# Patient Record
Sex: Male | Born: 1950 | Race: White | Hispanic: No | Marital: Married | Smoking: Never smoker
Health system: Southern US, Community
[De-identification: ages and names within clinical notes are randomized; demographics above are authoritative.]

## PROBLEM LIST (undated history)

## (undated) DIAGNOSIS — E119 Type 2 diabetes mellitus without complications: Secondary | ICD-10-CM

## (undated) DIAGNOSIS — F4310 Post-traumatic stress disorder, unspecified: Secondary | ICD-10-CM

## (undated) DIAGNOSIS — I10 Essential (primary) hypertension: Secondary | ICD-10-CM

## (undated) HISTORY — PX: BACK SURGERY: SHX140

## (undated) HISTORY — PX: HERNIA REPAIR: SHX51

---

## 2016-08-26 ENCOUNTER — Encounter: Payer: Self-pay | Admitting: Emergency Medicine

## 2016-08-26 ENCOUNTER — Emergency Department
Admission: EM | Admit: 2016-08-26 | Discharge: 2016-08-27 | Disposition: A | Discharge status: Other Acute Inpatient Hospital | Payer: Medicare Other | Attending: Emergency Medicine | Admitting: Emergency Medicine

## 2016-08-26 DIAGNOSIS — Y999 Unspecified external cause status: Secondary | ICD-10-CM | POA: Diagnosis not present

## 2016-08-26 DIAGNOSIS — Y929 Unspecified place or not applicable: Secondary | ICD-10-CM | POA: Diagnosis not present

## 2016-08-26 DIAGNOSIS — W2203XA Walked into furniture, initial encounter: Secondary | ICD-10-CM | POA: Diagnosis not present

## 2016-08-26 DIAGNOSIS — E119 Type 2 diabetes mellitus without complications: Secondary | ICD-10-CM | POA: Insufficient documentation

## 2016-08-26 DIAGNOSIS — S81011A Laceration without foreign body, right knee, initial encounter: Secondary | ICD-10-CM | POA: Diagnosis not present

## 2016-08-26 DIAGNOSIS — S8991XA Unspecified injury of right lower leg, initial encounter: Secondary | ICD-10-CM | POA: Diagnosis present

## 2016-08-26 DIAGNOSIS — I10 Essential (primary) hypertension: Secondary | ICD-10-CM | POA: Diagnosis not present

## 2016-08-26 DIAGNOSIS — Y939 Activity, unspecified: Secondary | ICD-10-CM | POA: Diagnosis not present

## 2016-08-26 DIAGNOSIS — S81811A Laceration without foreign body, right lower leg, initial encounter: Secondary | ICD-10-CM

## 2016-08-26 DIAGNOSIS — F4310 Post-traumatic stress disorder, unspecified: Secondary | ICD-10-CM

## 2016-08-26 DIAGNOSIS — R45851 Suicidal ideations: Secondary | ICD-10-CM

## 2016-08-26 HISTORY — DX: Post-traumatic stress disorder, unspecified: F43.10

## 2016-08-26 HISTORY — DX: Type 2 diabetes mellitus without complications: E11.9

## 2016-08-26 HISTORY — DX: Essential (primary) hypertension: I10

## 2016-08-26 LAB — COMPREHENSIVE METABOLIC PANEL
ALBUMIN: 4.1 g/dL (ref 3.5–5.0)
ALT: 107 U/L — ABNORMAL HIGH (ref 17–63)
ANION GAP: 11 (ref 5–15)
AST: 101 U/L — ABNORMAL HIGH (ref 15–41)
Alkaline Phosphatase: 108 U/L (ref 38–126)
BUN: 39 mg/dL — ABNORMAL HIGH (ref 6–20)
CO2: 22 mmol/L (ref 22–32)
Calcium: 9.8 mg/dL (ref 8.9–10.3)
Chloride: 103 mmol/L (ref 101–111)
Creatinine, Ser: 1.24 mg/dL (ref 0.61–1.24)
GFR calc Af Amer: >60 mL/min (ref >60)
GFR calc non Af Amer: 59 mL/min — ABNORMAL LOW (ref >60)
GLUCOSE: 378 mg/dL — AB (ref 65–99)
POTASSIUM: 5 mmol/L (ref 3.5–5.1)
Sodium: 136 mmol/L (ref 135–145)
TOTAL PROTEIN: 8.1 g/dL (ref 6.5–8.1)
Total Bilirubin: 0.6 mg/dL (ref 0.3–1.2)

## 2016-08-26 LAB — CBC
HEMATOCRIT: 44.4 % (ref 40.0–52.0)
Hemoglobin: 15.1 g/dL (ref 13.0–18.0)
MCH: 31.5 pg (ref 26.0–34.0)
MCHC: 34.1 g/dL (ref 32.0–36.0)
MCV: 92.4 fL (ref 80.0–100.0)
PLATELETS: 280 10*3/uL (ref 150–440)
RBC: 4.8 MIL/uL (ref 4.40–5.90)
RDW: 13.6 % (ref 11.5–14.5)
WBC: 7.2 10*3/uL (ref 3.8–10.6)

## 2016-08-26 LAB — SALICYLATE LEVEL: Salicylate Lvl: <7 mg/dL (ref 2.8–30.0)

## 2016-08-26 LAB — ETHANOL

## 2016-08-26 LAB — ACETAMINOPHEN LEVEL

## 2016-08-26 MED ORDER — INSULIN GLARGINE 100 UNIT/ML ~~LOC~~ SOLN
27.0000 [IU] | Freq: Once | SUBCUTANEOUS | Status: AC
  Administered 2016-08-27: 27 [IU] via SUBCUTANEOUS
  Filled 2016-08-26: qty 0.27
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
## 2016-08-26 NOTE — ED Notes (Signed)
EDP okay with no 1:1 if pt stays in hallway with close monitoring.  Pt not to be placed in a room without 1:1 sitter.

## 2016-08-27 LAB — GLUCOSE, CAPILLARY
GLUCOSE-CAPILLARY: 278 mg/dL — AB (ref 65–99)
Glucose-Capillary: 355 mg/dL — ABNORMAL HIGH (ref 65–99)
Glucose-Capillary: 315 mg/dL — ABNORMAL HIGH (ref 65–99)
Glucose-Capillary: 330 mg/dL — ABNORMAL HIGH (ref 65–99)

## 2016-08-27 MED ORDER — LORAZEPAM 2 MG PO TABS
2.0000 mg | ORAL_TABLET | Freq: Four times a day (QID) | ORAL | Status: DC | PRN
  Administered 2016-08-27: 2 mg via ORAL
  Filled 2016-08-27: qty 1

## 2016-08-27 MED ORDER — INSULIN ASPART 100 UNIT/ML ~~LOC~~ SOLN
0.0000 [IU] | Freq: Three times a day (TID) | SUBCUTANEOUS | Status: DC
  Administered 2016-08-27: 7 [IU] via SUBCUTANEOUS
  Filled 2016-08-27: qty 7

## 2016-08-27 NOTE — BH Assessment (Signed)
After Clinical research associatewriter spoke with ER Secretary Carlisle Beers(Luann), he [facility] and spoke with the AOD ([phone] ext. [phone] option 1) and [facility] before [facility] deadline(10pm). If anything was to change, someone would call and update them.

## 2016-08-27 NOTE — ED Notes (Signed)
Spoke with a lady on the phone that reported she is this pts daughter  She reported to me that [facility]  Dr Jeanie SewerMichael Hertzberg  [phone]  [phone]

## 2016-08-27 NOTE — ED Notes (Addendum)
Patient observed lying in bed with eyes closed  Even, unlabored respirations observed   NAD pt appears to be sleeping  I will continue to monitor along with every 15 minute visual observations and ongoing security monitoring     No 1:1 sitter present in his room  q15 minute rounding is occurring

## 2016-08-27 NOTE — ED Notes (Signed)
BEHAVIORAL HEALTH ROUNDING Patient sleeping: No. Patient alert and oriented: yes Behavior appropriate: Yes.  ; If no, describe:  Nutrition and fluids offered: yes Toileting and hygiene offered: Yes  Sitter present: q15 minute observations and security  monitoring Law enforcement present: Yes  ODS  

## 2016-08-27 NOTE — ED Notes (Signed)
[facility]   Pt to transport at this time

## 2016-08-27 NOTE — BH Assessment (Signed)
Patient has been accepted to [facility].  Accepting physician is Dr. Darrick Grinder.  Call report to [phone] ext. [phone].  Representative was [employer].  ER Staff is aware of it (Luann,ER Sect.& Amy T. Patient's Nurse)   Patient will need to arrived to the [facility] before 10:00pm

## 2016-08-27 NOTE — ED Notes (Signed)
Called SOC to initiate consult [phone]

## 2016-08-27 NOTE — ED Notes (Signed)
Pt ate breakfast.

## 2016-08-27 NOTE — ED Notes (Signed)
ED BHU PLACEMENT JUSTIFICATION Is the patient under IVC or is there intent for IVC: Yes.   Is the patient medically cleared: Yes.   Ac/hs CBG Is there vacancy in the ED BHU: Yes.   Is the population mix appropriate for patient: Yes.   Is the patient awaiting placement in inpatient or outpatient setting:    Has the patient had a psychiatric consult: Yes.  SOC  Survey of unit performed for contraband, proper placement and condition of furniture, tampering with fixtures in bathroom, shower, and each patient room: Yes.  ; Findings:  APPEARANCE/BEHAVIOR Calm and cooperative NEURO ASSESSMENT Orientation: oriented to self, place, situation   Denies pain Hallucinations: No.None noted (Hallucinations) Speech: Normal Gait: normal RESPIRATORY ASSESSMENT Even  Unlabored respirations  CARDIOVASCULAR ASSESSMENT Pulses equal   regular rate  Skin warm and dry   GASTROINTESTINAL ASSESSMENT no GI complaint EXTREMITIES Full ROM  PLAN OF CARE Provide calm/safe environment. Vital signs assessed twice daily. ED BHU Assessment once each 12-hour shift. Collaborate with TTS daily or as condition indicates. Assure the ED provider has rounded once each shift. Provide and encourage hygiene. Provide redirection as needed. Assess for escalating behavior; address immediately and inform ED provider.  Assess family dynamic and appropriateness for visitation as needed: Yes.  ; If necessary, describe findings:  Educate the patient/family about BHU procedures/visitation: Yes.  ; If necessary, describe findings:

## 2016-08-27 NOTE — BH Assessment (Signed)
Information faxed to [facility] and confirmed it was received ([phone] ext. [phone]). Pending review at this time. 

## 2016-08-27 NOTE — ED Notes (Signed)
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
## 2016-08-27 NOTE — ED Notes (Signed)
Pt with a phone call to [phone] - pt talking - loud at times  NAD observed

## 2016-08-27 NOTE — ED Notes (Signed)
Lantus verified by Spero GeraldsButch Woods, RN

## 2016-08-27 NOTE — ED Notes (Signed)
BEHAVIORAL HEALTH ROUNDING Patient sleeping: Yes.   Patient alert and oriented: eyes closed  Appears to be asleep Behavior appropriate: Yes.  ; If no, describe:  Nutrition and fluids offered: Yes  Toileting and hygiene offered: sleeping Sitter present: q 15 minute observations and security monitoring Law enforcement present: yes  ODS 

## 2016-08-27 NOTE — Progress Notes (Signed)
Inpatient Diabetes Program Recommendations  AACE/ADA: New Consensus Statement on Inpatient Glycemic Control (2015)  Target Ranges:  Prepandial:   less than 140 mg/dL      Peak postprandial:   less than 180 mg/dL (1-2 hours)      Critically ill patients:  140 - 180 mg/dL   Review of Glycemic Control  Diabetes history: DM 2 Outpatient Diabetes medications: Not listed Current orders for Inpatient glycemic control: None  Inpatient Diabetes Program Recommendations:   Glucose trend 355, 278 this am. [facility]. Consider Carb Modified diet while here. May also want to place patient on Novolog Moderate (0-15 units) correction TID + Novolog HS scale (0-5 units).  Thanks,  Christena DeemShannon Raveen Wieseler RN, MSN, [facility] Inpatient Diabetes Coordinator Team Pager [phone] (8a-5p)

## 2016-08-27 NOTE — ED Notes (Signed)
Called for transport [phone] [facility]

## 2016-10-25 ENCOUNTER — Encounter: Payer: Self-pay | Admitting: Emergency Medicine

## 2016-10-25 ENCOUNTER — Emergency Department: Payer: Medicare Other

## 2016-10-25 ENCOUNTER — Inpatient Hospital Stay
Admission: EM | Admit: 2016-10-25 | Discharge: 2016-10-26 | DRG: 918 | Disposition: A | Discharge status: Other Acute Inpatient Hospital | Payer: Medicare Other | Attending: Internal Medicine | Admitting: Internal Medicine

## 2016-10-25 DIAGNOSIS — Z7984 Long term (current) use of oral hypoglycemic drugs: Secondary | ICD-10-CM

## 2016-10-25 DIAGNOSIS — E162 Hypoglycemia, unspecified: Secondary | ICD-10-CM | POA: Diagnosis present

## 2016-10-25 DIAGNOSIS — F4310 Post-traumatic stress disorder, unspecified: Secondary | ICD-10-CM | POA: Diagnosis present

## 2016-10-25 DIAGNOSIS — K219 Gastro-esophageal reflux disease without esophagitis: Secondary | ICD-10-CM | POA: Diagnosis present

## 2016-10-25 DIAGNOSIS — I2510 Atherosclerotic heart disease of native coronary artery without angina pectoris: Secondary | ICD-10-CM | POA: Diagnosis present

## 2016-10-25 DIAGNOSIS — T1491XA Suicide attempt, initial encounter: Secondary | ICD-10-CM | POA: Diagnosis not present

## 2016-10-25 DIAGNOSIS — R778 Other specified abnormalities of plasma proteins: Secondary | ICD-10-CM | POA: Diagnosis present

## 2016-10-25 DIAGNOSIS — Z046 Encounter for general psychiatric examination, requested by authority: Secondary | ICD-10-CM

## 2016-10-25 DIAGNOSIS — R7989 Other specified abnormal findings of blood chemistry: Secondary | ICD-10-CM

## 2016-10-25 DIAGNOSIS — R748 Abnormal levels of other serum enzymes: Secondary | ICD-10-CM | POA: Diagnosis present

## 2016-10-25 DIAGNOSIS — Z955 Presence of coronary angioplasty implant and graft: Secondary | ICD-10-CM

## 2016-10-25 DIAGNOSIS — Z79899 Other long term (current) drug therapy: Secondary | ICD-10-CM

## 2016-10-25 DIAGNOSIS — I10 Essential (primary) hypertension: Secondary | ICD-10-CM | POA: Diagnosis present

## 2016-10-25 DIAGNOSIS — Z888 Allergy status to other drugs, medicaments and biological substances status: Secondary | ICD-10-CM | POA: Diagnosis not present

## 2016-10-25 DIAGNOSIS — T383X2A Poisoning by insulin and oral hypoglycemic [antidiabetic] drugs, intentional self-harm, initial encounter: Secondary | ICD-10-CM

## 2016-10-25 DIAGNOSIS — F39 Unspecified mood [affective] disorder: Secondary | ICD-10-CM | POA: Diagnosis present

## 2016-10-25 DIAGNOSIS — E11649 Type 2 diabetes mellitus with hypoglycemia without coma: Secondary | ICD-10-CM | POA: Diagnosis present

## 2016-10-25 LAB — CBC WITH DIFFERENTIAL/PLATELET
BASOS PCT: 1 %
Basophils Absolute: 0 10*3/uL (ref 0–0.1)
EOS ABS: 0.3 10*3/uL (ref 0–0.7)
Eosinophils Relative: 5 %
HCT: 36.1 % — ABNORMAL LOW (ref 40.0–52.0)
HEMOGLOBIN: 13 g/dL (ref 13.0–18.0)
Lymphocytes Relative: 32 %
Lymphs Abs: 1.9 10*3/uL (ref 1.0–3.6)
MCH: 33.1 pg (ref 26.0–34.0)
MCHC: 36.1 g/dL — ABNORMAL HIGH (ref 32.0–36.0)
MCV: 91.6 fL (ref 80.0–100.0)
Monocytes Absolute: 0.5 10*3/uL (ref 0.2–1.0)
Monocytes Relative: 9 %
NEUTROS PCT: 53 %
Neutro Abs: 3.2 10*3/uL (ref 1.4–6.5)
Platelets: 221 10*3/uL (ref 150–440)
RBC: 3.94 MIL/uL — AB (ref 4.40–5.90)
RDW: 13.5 % (ref 11.5–14.5)
WBC: 5.9 10*3/uL (ref 3.8–10.6)

## 2016-10-25 LAB — TROPONIN I
Troponin I: 0.25 ng/mL (ref <0.03)
Troponin I: 0.22 ng/mL (ref <0.03)
Troponin I: 0.24 ng/mL

## 2016-10-25 LAB — URINALYSIS, COMPLETE (UACMP) WITH MICROSCOPIC
BACTERIA UA: NONE SEEN
BILIRUBIN URINE: NEGATIVE
Glucose, UA: >=500 mg/dL — AB
Hgb urine dipstick: NEGATIVE
Ketones, ur: NEGATIVE mg/dL
Leukocytes, UA: NEGATIVE
NITRITE: NEGATIVE
PROTEIN: 30 mg/dL — AB
SPECIFIC GRAVITY, URINE: 1.005 (ref 1.005–1.030)
pH: 6 (ref 5.0–8.0)

## 2016-10-25 LAB — COMPREHENSIVE METABOLIC PANEL
ALBUMIN: 3.3 g/dL — AB (ref 3.5–5.0)
ALK PHOS: 88 U/L (ref 38–126)
ALT: 59 U/L (ref 17–63)
ANION GAP: 8 (ref 5–15)
AST: 59 U/L — ABNORMAL HIGH (ref 15–41)
BUN: 20 mg/dL (ref 6–20)
CALCIUM: 8.5 mg/dL — AB (ref 8.9–10.3)
CO2: 25 mmol/L (ref 22–32)
CREATININE: 0.75 mg/dL (ref 0.61–1.24)
Chloride: 108 mmol/L (ref 101–111)
GFR calc Af Amer: >60 mL/min (ref >60)
GFR calc non Af Amer: >60 mL/min (ref >60)
GLUCOSE: 135 mg/dL — AB (ref 65–99)
Potassium: 3.5 mmol/L (ref 3.5–5.1)
SODIUM: 141 mmol/L (ref 135–145)
Total Bilirubin: 0.6 mg/dL (ref 0.3–1.2)
Total Protein: 6.9 g/dL (ref 6.5–8.1)

## 2016-10-25 LAB — ACETAMINOPHEN LEVEL

## 2016-10-25 LAB — GLUCOSE, CAPILLARY
GLUCOSE-CAPILLARY: 40 mg/dL — AB (ref 65–99)
GLUCOSE-CAPILLARY: 69 mg/dL (ref 65–99)
GLUCOSE-CAPILLARY: 82 mg/dL (ref 65–99)
Glucose-Capillary: 111 mg/dL — ABNORMAL HIGH (ref 65–99)
Glucose-Capillary: 134 mg/dL — ABNORMAL HIGH (ref 65–99)
Glucose-Capillary: 222 mg/dL — ABNORMAL HIGH (ref 65–99)
Glucose-Capillary: 39 mg/dL — CL (ref 65–99)
Glucose-Capillary: 51 mg/dL — ABNORMAL LOW (ref 65–99)
Glucose-Capillary: 66 mg/dL (ref 65–99)
Glucose-Capillary: 68 mg/dL (ref 65–99)

## 2016-10-25 LAB — URINE DRUG SCREEN, QUALITATIVE (ARMC ONLY)
Amphetamines, Ur Screen: NOT DETECTED
BARBITURATES, UR SCREEN: NOT DETECTED
BENZODIAZEPINE, UR SCRN: NOT DETECTED
CANNABINOID 50 NG, UR ~~LOC~~: NOT DETECTED
Cocaine Metabolite,Ur ~~LOC~~: NOT DETECTED
MDMA (Ecstasy)Ur Screen: NOT DETECTED
METHADONE SCREEN, URINE: NOT DETECTED
OPIATE, UR SCREEN: NOT DETECTED
PHENCYCLIDINE (PCP) UR S: NOT DETECTED
Tricyclic, Ur Screen: NOT DETECTED

## 2016-10-25 LAB — GLUCOSE, RANDOM: Glucose, Bld: 54 mg/dL — ABNORMAL LOW (ref 65–99)

## 2016-10-25 LAB — SALICYLATE LEVEL: Salicylate Lvl: <7 mg/dL (ref 2.8–30.0)

## 2016-10-25 LAB — ETHANOL

## 2016-10-25 MED ORDER — DOCUSATE SODIUM 100 MG PO CAPS
100.0000 mg | ORAL_CAPSULE | Freq: Two times a day (BID) | ORAL | Status: DC
Start: 2016-10-25 — End: 2016-10-26
  Administered 2016-10-26: 10:00:00 100 mg via ORAL
  Filled 2016-10-25: qty 1

## 2016-10-25 MED ORDER — DEXTROSE 50 % IV SOLN
INTRAVENOUS | Status: AC
  Administered 2016-10-25: 25 mL via INTRAVENOUS
  Filled 2016-10-25: qty 50

## 2016-10-25 MED ORDER — ACETAMINOPHEN 325 MG PO TABS: 650.0000 mg | ORAL_TABLET | Freq: Four times a day (QID) | ORAL | Status: DC | PRN

## 2016-10-25 MED ORDER — DEXTROSE 50 % IV SOLN
25.0000 g | Freq: Once | INTRAVENOUS | Status: AC
  Administered 2016-10-25: 50 mL via INTRAVENOUS

## 2016-10-25 MED ORDER — ASPIRIN EC 81 MG PO TBEC
81.0000 mg | DELAYED_RELEASE_TABLET | Freq: Every day | ORAL | Status: DC
  Administered 2016-10-26: 81 mg via ORAL
  Filled 2016-10-25: qty 1

## 2016-10-25 MED ORDER — DEXTROSE 10 % IV SOLN
INTRAVENOUS | Status: DC
  Administered 2016-10-25 – 2016-10-26 (×2): via INTRAVENOUS

## 2016-10-25 MED ORDER — DEXTROSE 50 % IV SOLN
INTRAVENOUS | Status: AC
  Administered 2016-10-25: 50 mL via INTRAVENOUS
  Filled 2016-10-25: qty 50

## 2016-10-25 MED ORDER — ONDANSETRON HCL 4 MG PO TABS: 4.0000 mg | ORAL_TABLET | Freq: Four times a day (QID) | ORAL | Status: DC | PRN

## 2016-10-25 MED ORDER — ONDANSETRON HCL 4 MG/2ML IJ SOLN: 4.0000 mg | Freq: Four times a day (QID) | INTRAMUSCULAR | Status: DC | PRN

## 2016-10-25 MED ORDER — ASPIRIN 81 MG PO CHEW
324.0000 mg | CHEWABLE_TABLET | Freq: Once | ORAL | Status: AC
  Administered 2016-10-25: 324 mg via ORAL
  Filled 2016-10-25: qty 4

## 2016-10-25 MED ORDER — ACETAMINOPHEN 650 MG RE SUPP: 650.0000 mg | Freq: Four times a day (QID) | RECTAL | Status: DC | PRN

## 2016-10-25 MED ORDER — ENOXAPARIN SODIUM 40 MG/0.4ML ~~LOC~~ SOLN
40.0000 mg | SUBCUTANEOUS | Status: DC
Start: 2016-10-25 — End: 2016-10-26

## 2016-10-25 MED ORDER — DEXTROSE 50 % IV SOLN
25.0000 mL | Freq: Once | INTRAVENOUS | Status: AC
  Administered 2016-10-25: 25 mL via INTRAVENOUS

## 2016-10-25 MED ORDER — SENNOSIDES-DOCUSATE SODIUM 8.6-50 MG PO TABS: 1.0000 | ORAL_TABLET | Freq: Every evening | ORAL | Status: DC | PRN

## 2016-10-25 NOTE — Care Management Note (Signed)
Case Management Note  Patient Details  Name: Gerald Ponce MRN: [id number] Date of Birth: 23-Apr-1951  Subjective/Objective:         Patient is a [facility] patient per MD, but is too unstable for transport  . Paperwork completed for same by Dr Wilfred LacyJ. Williams, and unit clerk Tomi BambergerLisa Wheeler will contact VA to provide notification. Please note-Pt. Is also IVC'd at this time.         Action/Plan:   Expected Discharge Date:                  Expected Discharge Plan:     In-House Referral:     Discharge planning Services     Post Acute Care Choice:    Choice offered to:     DME Arranged:    DME Agency:     HH Arranged:    HH Agency:     Status of Service:     If discussed at [employer] Length of Stay Meetings, dates discussed:    Additional Comments:  Berna BueCheryl Advaith Lamarque, RN 10/25/2016, 1:54 PM

## 2016-10-25 NOTE — ED Notes (Signed)
Pt alert and oriented. Given orange juice to drink.

## 2016-10-25 NOTE — Progress Notes (Addendum)
Blood sugar 69, patient given orange juice and is eating meal

## 2016-10-25 NOTE — H&P (Signed)
[facility] - [place] at [facility]   PATIENT NAME: Gerald Ponce    MR#:  [id number]  DATE OF BIRTH:  [birth year]  DATE OF ADMISSION:  10/25/2016  PRIMARY CARE PHYSICIAN[facility]  REQUESTING/REFERRING PHYSICIAN: Dr. Mayford KnifeWilliams  CHIEF COMPLAINT:   I took multiple shots of insulin to harm myself. Patient presents with suicidal intention. HISTORY OF PRESENT ILLNESS:  Gerald Ponce  is a 66 y.o. male with a known history of PTSD, diabetes, hypertension, CAD status post stent in the remote past comes to the emergency room after he was found unresponsive by patient's family member. Patient reportedly took several doses off unknown amount of insulin NovoLog in attempt for suicidal ideation and killing himself. He was found unresponsive. EMS brought him to the emergency room sugar was 111. And Route he did receive D50. I could not find documentation of how low patient's sugar was spent in EMS found patient. Patient currently is alert oriented 3 current sugar is 66 he has had several cups of orange juice and food to eat. His stepdaughter is in the room Patient reports it was suicidal ideation/intention since he has a lot going on. He apparently had a court date today. Patient reports to much is going around and I cannot take it anymore IVC was initiated by ER physician  Patient is being admitted with hypoglycemia secondary to overdose on insulin, intentional Past Medical History:  Diagnosis Date  . Diabetes mellitus without complication (HCC)   . Hypertension   . PTSD (post-traumatic stress disorder)     PAST SURGICAL HISTOIRY:   Past Surgical History:  Procedure Laterality Date  . BACK SURGERY    . HERNIA REPAIR      SOCIAL HISTORY:   Social History  Substance Use Topics  . Smoking status: Never Smoker  . Smokeless tobacco: Never Used  . Alcohol use No    FAMILY HISTORY:  No family history on file.  DRUG ALLERGIES:   Allergies  Allergen Reactions   . Fenofibrate Other (See Comments)    "kidney shut down"    REVIEW OF SYSTEMS:  Review of Systems  Constitutional: Negative for chills, fever and weight loss.  HENT: Negative for ear discharge, ear pain and nosebleeds.   Eyes: Negative for blurred vision, pain and discharge.  Respiratory: Negative for sputum production, shortness of breath, wheezing and stridor.   Cardiovascular: Negative for chest pain, palpitations, orthopnea and PND.  Gastrointestinal: Negative for abdominal pain, diarrhea, nausea and vomiting.  Genitourinary: Negative for frequency and urgency.  Musculoskeletal: Negative for back pain and joint pain.  Neurological: Positive for weakness. Negative for sensory change, speech change and focal weakness.  Psychiatric/Behavioral: Negative for depression and hallucinations. The patient is not nervous/anxious.      MEDICATIONS AT HOME:   Prior to Admission medications   Medication Sig Start Date End Date Taking? Authorizing Provider  metFORMIN (GLUCOPHAGE) 1000 MG tablet Take 500 mg by mouth 2 (two) times daily with a meal.   Yes Historical Provider, MD  omeprazole (PRILOSEC) 20 MG capsule Take 20 mg by mouth daily.   Yes Historical Provider, MD      VITAL SIGNS:  Blood pressure (!) 190/75, pulse 68, temperature 97.6 F (36.4 C), temperature source Oral, resp. rate 12, height 5\' 10"  (1.778 m), weight 81.6 kg (180 lb), SpO2 99 %.  PHYSICAL EXAMINATION:  GENERAL:  66 y.o.-year-old patient lying in the bed with no acute distress.  EYES: Pupils equal, round, reactive to light  and accommodation. No scleral icterus. Extraocular muscles intact.  HEENT: Head atraumatic, normocephalic. Oropharynx and nasopharynx clear.  NECK:  Supple, no jugular venous distention. No thyroid enlargement, no tenderness.  LUNGS: Normal breath sounds bilaterally, no wheezing, rales,rhonchi or crepitation. No use of accessory muscles of respiration.  CARDIOVASCULAR: S1, S2 normal. No murmurs,  rubs, or gallops.  ABDOMEN: Soft, nontender, nondistended. Bowel sounds present. No organomegaly or mass.  EXTREMITIES: No pedal edema, cyanosis, or clubbing.  NEUROLOGIC: Cranial nerves II through XII are intact. Muscle strength 5/5 in all extremities. Sensation intact. Gait not checked.  PSYCHIATRIC: The patient is alert and oriented x 3.  SKIN: No obvious rash, lesion, or ulcer.   LABORATORY PANEL:   CBC  Recent Labs Lab 10/25/16 1243  WBC 5.9  HGB 13.0  HCT 36.1*  PLT 221   ------------------------------------------------------------------------------------------------------------------  Chemistries   Recent Labs Lab 10/25/16 1243  NA 141  K 3.5  CL 108  CO2 25  GLUCOSE 135*  BUN 20  CREATININE 0.75  CALCIUM 8.5*  AST 59*  ALT 59  ALKPHOS 88  BILITOT 0.6   ------------------------------------------------------------------------------------------------------------------  Cardiac Enzymes  Recent Labs Lab 10/25/16 1243  TROPONINI 0.25*   ------------------------------------------------------------------------------------------------------------------  RADIOLOGY:  Dg Chest 1 View  Result Date: 10/25/2016 CLINICAL DATA:  Possible overdose this morning. EXAM: CHEST 1 VIEW COMPARISON:  None. FINDINGS: Low volume chest with interstitial crowding. There is no edema, consolidation, effusion, or pneumothorax. Normal heart size and mediastinal contours. Coronary stent noted. IMPRESSION: Low volume portable chest.  No acute finding. Electronically Signed   By: Marnee Spring M.D.   On: 10/25/2016 13:20    EKG:    IMPRESSION AND PLAN:   Gerald Ponce  is a 66 y.o. male with a known history of PTSD, diabetes, hypertension, CAD status post stent in the remote past comes to the emergency room after he was found unresponsive by patient's family member. Patient reportedly took several doses off unknown amount of insulin NovoLog in attempt for suicidal ideation and  killing himself. He was found unresponsive. EMS brought him to the emergency room sugar was 111. And Route he did receive D50. I could not find documentation of how low patient's sugar was spent in EMS found patient.   1. Hypoglycemia secondary to intentional overdose with NovoLog unknown amount  -Admitted to medical floor  -Patient has IVC. Will have sitter in the room  -Continue IV D10 and check sugars every 3 hourly  -Psych consultation secondary to intentional overdose and suicidal ideation  -Hold off all oral diabetes meds   2. Elevated troponin with history of CAD -Patient denies any chest pain, EKG is just not sure any acute ST elevation or depression -We'll cycle cardiac enzymes and continue aspirin -Patient's daughter to bring his medication list  3. PTSD -We'll resume psych meds after psych consultation is done  4. GERD continue omeprazole  5. DVT prophylaxis subcutaneous Lovenox   All the records are reviewed and case discussed with ED provider. Management plans discussed with the patient, family and they are in agreement.  CODE STATUS :Full  TOTAL TIME TAKING CARE OF THIS PATIENT : .    Kristjan Derner M.D on 10/25/2016 at 2:32 PM  Between 7am to 6pm - Pager - [phone]  After 6pm go to [email or web address] - password [email or web address] [facility]  [facility]  Office  [phone]  CC: Primary care physician; Pcp Not In System

## 2016-10-25 NOTE — ED Notes (Signed)
SPOKE  WITH  LARRY AT  [facility]  [facility]  TOLD HIM  PT  WAS  TO  UNSTABLE  FOR  TRANSFER PER  DR  Mayford KnifeWILLIAMS MD ALL  [facility]  PAPERWORK  ON  CHART  INFORMED  CHERYL  WILDER

## 2016-10-25 NOTE — Progress Notes (Addendum)
CBG 39 , 54 per lab.  Gave D50 25 gram IV.  Repeat CBG is 134.  Spoke with Dr Anne HahnWillis and increased D10 IV to 100 cc/hr.  Pt drank a carton of milk.  Will repeat at 0000.  Pt reports blurred vision but no other symptoms. Henriette CombsSarah Kaan Tosh RN

## 2016-10-25 NOTE — ED Notes (Signed)
CBG 40 mg/dl. Dr. Mayford KnifeWilliams notified. Orders received.

## 2016-10-25 NOTE — Care Management Note (Signed)
Case Management Note  Patient Details  Name: Ronnette HilaJames Omar MRN: [id number] Date of Birth: 08-17-1951  Subjective/Objective:       [facility] the patient and unstable status at present. Pt. Is going to be admitted here and will be under IVC.    They will follow for changes in stability and possible transfer later.         Action/Plan:   Expected Discharge Date:                  Expected Discharge Plan:     In-House Referral:     Discharge planning Services     Post Acute Care Choice:    Choice offered to:     DME Arranged:    DME Agency:     HH Arranged:    HH Agency:     Status of Service:     If discussed at [employer] Length of Stay Meetings, dates discussed:    Additional Comments:  Berna BueCheryl Kaitland Lewellyn, RN 10/25/2016, 2:09 PM
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
## 2016-10-25 NOTE — ED Notes (Addendum)
Dr. Allena KatzPatel at bedside. Pt alert and oriented. Pt eating graham crackers and peanut butter.
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
## 2016-10-25 NOTE — ED Triage Notes (Addendum)
Pt arrived via EMS from home. Pt overdosed on unknown amount of  Insulin. EMS reports pt was unresponsive on arrival. EMS administered narcan and dextrose. VSS.

## 2016-10-25 NOTE — ED Provider Notes (Signed)
[facility] Emergency Department Provider Note        Time seen: ----------------------------------------- 12:45 PM on 10/25/2016 -----------------------------------------  L5 caveat: Review of systems and history is limited by altered mental status  I have reviewed the triage vital signs and the nursing notes.   HISTORY  Chief Complaint Drug Overdose    HPI Gerald Ponce is a 66 y.o. male who presents to ER being brought by EMS from home after an overdose. Patient reportedly had taken an unknown amount of his insulin in order to kill himself. Patient was due in court today. He is brought in unresponsive.   Past Medical History:  Diagnosis Date  . Diabetes mellitus without complication (HCC)   . Hypertension   . PTSD (post-traumatic stress disorder)     There are no active problems to display for this patient.   Past Surgical History:  Procedure Laterality Date  . BACK SURGERY    . HERNIA REPAIR      Allergies Fenofibrate  Social History Social History  Substance Use Topics  . Smoking status: Never Smoker  . Smokeless tobacco: Never Used  . Alcohol use No    Review of Systems Unknown ____________________________________________   PHYSICAL EXAM:  VITAL SIGNS: ED Triage Vitals  Enc Vitals Group     BP 10/25/16 1242 (!) 190/75     Pulse Rate 10/25/16 1242 68     Resp 10/25/16 1242 12     Temp 10/25/16 1242 97.6 F (36.4 C)     Temp Source 10/25/16 1242 Oral     SpO2 10/25/16 1242 99 %     Weight 10/25/16 1240 180 lb (81.6 kg)     Height 10/25/16 1240 5\' 10"  (1.778 m)     Head Circumference --      Peak Flow --      Pain Score --      Pain Loc --      Pain Edu? --      Excl. in GC? --     Constitutional: Patient is not alert and poorly responsive to pain Eyes: Conjunctivae are normal. PERRL. Normal extraocular movements. ENT   Head: Normocephalic and atraumatic.   Nose: No congestion/rhinnorhea.    Mouth/Throat: Mucous membranes are moist.   Neck: No stridor. Cardiovascular: Normal rate, regular rhythm. No murmurs, rubs, or gallops. Respiratory: Normal respiratory effort without tachypnea nor retractions. Breath sounds are clear and equal bilaterally. No wheezes/rales/rhonchi. Gastrointestinal: Soft and nontender. Normal bowel sounds Musculoskeletal: Nontender with normal range of motion in all extremities.  Neurologic:  Normal speech and language. No gross focal neurologic deficits are appreciated.  Skin:  Skin is warm, dry and intact. No rash noted. ____________________________________________  EKG: Interpreted by me. Sinus rhythm with rate of 65 bpm, normal PR interval, normal QRS, normal QT.  ____________________________________________  ED COURSE:  Pertinent labs & imaging results that were available during my care of the patient were reviewed by me and considered in my medical decision making (see chart for details). Patient presents to ER in no distress with altered mental status after an overdose. Left EJ peripheral 20-gauge IV placed by me  Patient subsequently stimulated with painful stimuli such as a tongue depressor into the posterior pharynx. Patient quickly regained consciousness and removed the tongue depressor. He is now answering questions appropriately. He will be placed under involuntary commitment.   Procedures ____________________________________________   LABS (pertinent positives/negatives)  Labs Reviewed  CBC WITH DIFFERENTIAL/PLATELET - Abnormal; Notable for the following:  Result Value   RBC 3.94 (*)    HCT 36.1 (*)    MCHC 36.1 (*)    All other components within normal limits  COMPREHENSIVE METABOLIC PANEL - Abnormal; Notable for the following:    Glucose, Bld 135 (*)    Calcium 8.5 (*)    Albumin 3.3 (*)    AST 59 (*)    All other components within normal limits  TROPONIN I - Abnormal; Notable for the following:    Troponin I 0.25 (*)     All other components within normal limits  URINALYSIS, COMPLETE (UACMP) WITH MICROSCOPIC - Abnormal; Notable for the following:    Color, Urine STRAW (*)    APPearance CLEAR (*)    Glucose, UA >=500 (*)    Protein, ur 30 (*)    Squamous Epithelial / LPF 0-5 (*)    All other components within normal limits  ACETAMINOPHEN LEVEL - Abnormal; Notable for the following:    Acetaminophen (Tylenol), Serum <10 (*)    All other components within normal limits  GLUCOSE, CAPILLARY - Abnormal; Notable for the following:    Glucose-Capillary 111 (*)    All other components within normal limits  ETHANOL  SALICYLATE LEVEL  URINE DRUG SCREEN, QUALITATIVE ([facility] ONLY)  CBG MONITORING, ED   CRITICAL CARE Performed by: Emily FilbertWilliams, Jonathan E   Total critical care time: 30 minutes  Critical care time was exclusive of separately billable procedures and treating other patients.  Critical care was necessary to treat or prevent imminent or life-threatening deterioration.  Critical care was time spent personally by me on the following activities: development of treatment plan with patient and/or surrogate as well as nursing, discussions with consultants, evaluation of patient's response to treatment, examination of patient, obtaining history from patient or surrogate, ordering and performing treatments and interventions, ordering and review of laboratory studies, ordering and review of radiographic studies, pulse oximetry and re-evaluation of patient's condition.  RADIOLOGY Images were viewed by me  Chest x-ray IMPRESSION: Low volume portable chest. No acute finding. ____________________________________________  FINAL ASSESSMENT AND PLAN  Insulin Overdose, elevated troponin, involuntary commitment  Plan: Patient with labs and imaging as dictated above. Patient had presented to the ER unresponsive which was largely behavioral in etiology. He is now responsive and following commands. Patient  describes that he is going through a difficult time and he is due to be seen in the court system. Patient is currently on a D10 drip, he has received additional D50 here. He also has elevated troponin, he is being given oral aspirin. He will likely need step down admission for close monitoring. He is also under involuntary commitment.   Emily FilbertWilliams, Jonathan E, MD   Note: This note was generated in part or whole with voice recognition software. Voice recognition is usually quite accurate but there are transcription errors that can and very often do occur. I apologize for any typographical errors that were not detected and corrected.     Emily FilbertJonathan E Williams, MD 10/25/16 [id number]

## 2016-10-26 ENCOUNTER — Encounter: Payer: Self-pay | Admitting: Psychiatry

## 2016-10-26 ENCOUNTER — Inpatient Hospital Stay
Admission: RE | Admit: 2016-10-26 | Discharge: 2016-10-29 | DRG: 885 | Disposition: A | Discharge status: Home / Self Care | Payer: Medicare Other | Source: Intra-hospital | Attending: Psychiatry | Admitting: Psychiatry

## 2016-10-26 DIAGNOSIS — K219 Gastro-esophageal reflux disease without esophagitis: Secondary | ICD-10-CM | POA: Diagnosis present

## 2016-10-26 DIAGNOSIS — E1140 Type 2 diabetes mellitus with diabetic neuropathy, unspecified: Secondary | ICD-10-CM | POA: Diagnosis present

## 2016-10-26 DIAGNOSIS — F329 Major depressive disorder, single episode, unspecified: Secondary | ICD-10-CM | POA: Diagnosis present

## 2016-10-26 DIAGNOSIS — T1491XA Suicide attempt, initial encounter: Secondary | ICD-10-CM | POA: Diagnosis present

## 2016-10-26 DIAGNOSIS — Z79899 Other long term (current) drug therapy: Secondary | ICD-10-CM

## 2016-10-26 DIAGNOSIS — I10 Essential (primary) hypertension: Secondary | ICD-10-CM | POA: Diagnosis present

## 2016-10-26 DIAGNOSIS — F419 Anxiety disorder, unspecified: Secondary | ICD-10-CM | POA: Diagnosis present

## 2016-10-26 DIAGNOSIS — F4310 Post-traumatic stress disorder, unspecified: Secondary | ICD-10-CM | POA: Diagnosis present

## 2016-10-26 DIAGNOSIS — Z888 Allergy status to other drugs, medicaments and biological substances status: Secondary | ICD-10-CM

## 2016-10-26 DIAGNOSIS — E559 Vitamin D deficiency, unspecified: Secondary | ICD-10-CM | POA: Diagnosis present

## 2016-10-26 DIAGNOSIS — F39 Unspecified mood [affective] disorder: Secondary | ICD-10-CM | POA: Diagnosis present

## 2016-10-26 DIAGNOSIS — Z7984 Long term (current) use of oral hypoglycemic drugs: Secondary | ICD-10-CM

## 2016-10-26 DIAGNOSIS — E785 Hyperlipidemia, unspecified: Secondary | ICD-10-CM | POA: Diagnosis present

## 2016-10-26 DIAGNOSIS — E8881 Metabolic syndrome: Secondary | ICD-10-CM | POA: Diagnosis present

## 2016-10-26 LAB — TROPONIN I: Troponin I: 0.2 ng/mL (ref <0.03)

## 2016-10-26 LAB — GLUCOSE, CAPILLARY
GLUCOSE-CAPILLARY: 218 mg/dL — AB (ref 65–99)
GLUCOSE-CAPILLARY: 156 mg/dL — AB (ref 65–99)
GLUCOSE-CAPILLARY: 292 mg/dL — AB (ref 65–99)
GLUCOSE-CAPILLARY: 292 mg/dL — AB (ref 65–99)
GLUCOSE-CAPILLARY: 247 mg/dL — AB (ref 65–99)
GLUCOSE-CAPILLARY: 141 mg/dL — AB (ref 65–99)

## 2016-10-26 MED ORDER — ASPIRIN 81 MG PO TBEC: 81.0000 mg | DELAYED_RELEASE_TABLET | Freq: Every day | ORAL | 0 refills | Status: DC

## 2016-10-26 MED ORDER — LISINOPRIL 10 MG PO TABS
40.0000 mg | ORAL_TABLET | Freq: Every day | ORAL | Status: DC
  Administered 2016-10-26: 40 mg via ORAL
  Filled 2016-10-26: qty 4

## 2016-10-26 MED ORDER — PRAZOSIN HCL 2 MG PO CAPS
2.0000 mg | ORAL_CAPSULE | Freq: Every day | ORAL | Status: DC
  Administered 2016-10-26: 2 mg via ORAL
  Filled 2016-10-26: qty 1

## 2016-10-26 MED ORDER — ATORVASTATIN CALCIUM 20 MG PO TABS
80.0000 mg | ORAL_TABLET | Freq: Every day | ORAL | Status: DC
  Administered 2016-10-26 – 2016-10-28 (×3): 80 mg via ORAL
  Filled 2016-10-26 (×3): qty 4

## 2016-10-26 MED ORDER — DILTIAZEM HCL ER COATED BEADS 120 MG PO CP24
120.0000 mg | ORAL_CAPSULE | Freq: Every day | ORAL | Status: DC
  Administered 2016-10-26: 120 mg via ORAL
  Filled 2016-10-26: qty 1

## 2016-10-26 MED ORDER — ALUM & MAG HYDROXIDE-SIMETH 200-200-20 MG/5ML PO SUSP: 30.0000 mL | ORAL | Status: DC | PRN

## 2016-10-26 MED ORDER — ASPIRIN EC 81 MG PO TBEC
81.0000 mg | DELAYED_RELEASE_TABLET | Freq: Every day | ORAL | Status: DC
  Administered 2016-10-26 – 2016-10-29 (×4): 81 mg via ORAL
  Filled 2016-10-26 (×4): qty 1

## 2016-10-26 MED ORDER — HYDROXYZINE HCL 50 MG PO TABS
50.0000 mg | ORAL_TABLET | Freq: Three times a day (TID) | ORAL | Status: DC | PRN
Start: 2016-10-26 — End: 2016-10-29
  Administered 2016-10-27 – 2016-10-28 (×2): 50 mg via ORAL
  Filled 2016-10-26 (×2): qty 1

## 2016-10-26 MED ORDER — INSULIN ASPART 100 UNIT/ML ~~LOC~~ SOLN
0.0000 [IU] | Freq: Three times a day (TID) | SUBCUTANEOUS | Status: DC
  Administered 2016-10-26: 12:00:00 5 [IU] via SUBCUTANEOUS
  Filled 2016-10-26: qty 5

## 2016-10-26 MED ORDER — ACETAMINOPHEN 325 MG PO TABS: 650.0000 mg | ORAL_TABLET | Freq: Four times a day (QID) | ORAL | Status: DC | PRN

## 2016-10-26 MED ORDER — GABAPENTIN 300 MG PO CAPS
300.0000 mg | ORAL_CAPSULE | Freq: Two times a day (BID) | ORAL | Status: DC
  Administered 2016-10-26 – 2016-10-29 (×6): 300 mg via ORAL
  Filled 2016-10-26 (×6): qty 1

## 2016-10-26 MED ORDER — MAGNESIUM HYDROXIDE 400 MG/5ML PO SUSP: 30.0000 mL | Freq: Every day | ORAL | Status: DC | PRN

## 2016-10-26 MED ORDER — VITAMIN D 1000 UNITS PO TABS
1000.0000 [IU] | ORAL_TABLET | Freq: Every day | ORAL | Status: DC
  Administered 2016-10-26 – 2016-10-29 (×4): 1000 [IU] via ORAL
  Filled 2016-10-26 (×5): qty 1

## 2016-10-26 MED ORDER — PANTOPRAZOLE SODIUM 40 MG PO TBEC
40.0000 mg | DELAYED_RELEASE_TABLET | Freq: Every day | ORAL | Status: DC
  Administered 2016-10-26 – 2016-10-29 (×4): 40 mg via ORAL
  Filled 2016-10-26 (×4): qty 1

## 2016-10-26 MED ORDER — METFORMIN HCL 500 MG PO TABS
500.0000 mg | ORAL_TABLET | Freq: Two times a day (BID) | ORAL | Status: DC
  Administered 2016-10-26 – 2016-10-29 (×6): 500 mg via ORAL
  Filled 2016-10-26 (×6): qty 1

## 2016-10-26 MED ORDER — INSULIN ASPART 100 UNIT/ML ~~LOC~~ SOLN
0.0000 [IU] | Freq: Every day | SUBCUTANEOUS | Status: DC
  Filled 2016-10-26: qty 3

## 2016-10-26 MED ORDER — INSULIN ASPART 100 UNIT/ML ~~LOC~~ SOLN
0.0000 [IU] | Freq: Three times a day (TID) | SUBCUTANEOUS | Status: DC
  Administered 2016-10-26: 5 [IU] via SUBCUTANEOUS
  Administered 2016-10-27: 2 [IU] via SUBCUTANEOUS
  Administered 2016-10-27: 8 [IU] via SUBCUTANEOUS
  Administered 2016-10-27: 11 [IU] via SUBCUTANEOUS
  Administered 2016-10-28: 2 [IU] via SUBCUTANEOUS
  Administered 2016-10-28: 5 [IU] via SUBCUTANEOUS
  Administered 2016-10-29 (×2): 3 [IU] via SUBCUTANEOUS
  Filled 2016-10-26: qty 3
  Filled 2016-10-26: qty 15
  Filled 2016-10-26: qty 8
  Filled 2016-10-26: qty 5
  Filled 2016-10-26: qty 3
  Filled 2016-10-26: qty 2
  Filled 2016-10-26: qty 13
  Filled 2016-10-26: qty 7

## 2016-10-26 MED ORDER — INSULIN ASPART 100 UNIT/ML ~~LOC~~ SOLN
4.0000 [IU] | Freq: Three times a day (TID) | SUBCUTANEOUS | Status: DC
  Administered 2016-10-26 – 2016-10-27 (×4): 4 [IU] via SUBCUTANEOUS
  Filled 2016-10-26 (×2): qty 4

## 2016-10-26 MED ORDER — VENLAFAXINE HCL ER 75 MG PO CP24
150.0000 mg | ORAL_CAPSULE | Freq: Every day | ORAL | Status: DC
  Administered 2016-10-27 – 2016-10-29 (×3): 150 mg via ORAL
  Filled 2016-10-26 (×3): qty 2

## 2016-10-26 NOTE — Progress Notes (Signed)
Patient pleasant and cooperative during admission assessment. Patient denies SI/HI at this time. Patient denies AVH. Patient informed of fall risk status, fall risk assessed "moderate" at this time. Patient oriented to unit/staff/room. Patient denies any questions/concerns at this time. Patient safe on unit with Q15 minute checks for safety. Skin assessment & body search done,no contraband found.

## 2016-10-26 NOTE — Care Management (Signed)
Spoke with Tania at [facility]. Discussed that [facility]. Gerald [facility]once [facility].  Tania indicated that after [facility], Mr Gerald Ponce would need to given the option to [facility]. Gwenette GreetBrenda S Romeka Scifres RN MSN CCM Care Management

## 2016-10-26 NOTE — Progress Notes (Signed)
Inpatient Diabetes Program Recommendations  AACE/ADA: New Consensus Statement on Inpatient Glycemic Control (2015)  Target Ranges:  Prepandial:   less than 140 mg/dL      Peak postprandial:   less than 180 mg/dL (1-2 hours)      Critically ill patients:  140 - 180 mg/dL   Lab Results  Component Value Date   GLUCAP 156 (H) 10/26/2016    Review of Glycemic Control  Results for Gerald Ponce, Gerald Ponce (MRN [id number]) as of 10/26/2016 07:45  Ref. Range 10/25/2016 21:45 10/25/2016 22:08 10/26/2016 00:03 10/26/2016 02:47 10/26/2016 05:58  Glucose-Capillary Latest Ref Range: 65 - 99 mg/dL 39 (LL) 914134 (H) 782222 (H) 218 (H) 156 (H)    Diabetes history: Type 2 Outpatient Diabetes medications: Metformin 500mg  bid Current orders for Inpatient glycemic control: none  Inpatient Diabetes Program Recommendations:   Consider d/c CBG checks q3h and conder starting Novolog 0-9 units tid with meals   Per ADA recommendations "consider performing an A1C on all patients with diabetes or hyperglycemia admitted to the hospital if not performed in the prior 3 months".  Susette RacerJulie Gizelle Whetsel, RN, BA, MHA, CDE Diabetes Coordinator Inpatient Diabetes Program  [phone] (Team Pager) [phone] [facility] Office) 10/26/2016 7:52 AM

## 2016-10-26 NOTE — Discharge Instructions (Signed)
[facility] [place] at [facility]  DIET:  Diabetic diet  DISCHARGE CONDITION:  Good  ACTIVITY:  Activity as tolerated  OXYGEN:  Home Oxygen: No.   Oxygen Delivery: room air  DISCHARGE LOCATION:  home    ADDITIONAL DISCHARGE INSTRUCTION:   If you experience worsening of your admission symptoms, develop shortness of breath, life threatening emergency, suicidal or homicidal thoughts you must seek medical attention immediately by calling 911 or calling your MD immediately  if symptoms less severe.  You Must read complete instructions/literature along with all the possible adverse reactions/side effects for all the Medicines you take and that have been prescribed to you. Take any new Medicines after you have completely understood and accpet all the possible adverse reactions/side effects.   Please note  You were cared for by a hospitalist during your hospital stay. If you have any questions about your discharge medications or the care you received while you were in the hospital after you are discharged, you can call the unit and asked to speak with the hospitalist on call if the hospitalist that took care of you is not available. Once you are discharged, your primary care physician will handle any further medical issues. Please note that NO REFILLS for any discharge medications will be authorized once you are discharged, as it is imperative that you return to your primary care physician (or establish a relationship with a primary care physician if you do not have one) for your aftercare needs so that they can reassess your need for medications and monitor your lab values.

## 2016-10-26 NOTE — [facility] Suicide Risk Assessment (Signed)
[facility]   Nursing information obtained from:    Demographic factors:    Current Mental Status:    Loss Factors:    Historical Factors:    Risk Reduction Factors:     Total Time spent with patient: 1 hour Principal Problem: Episodic mood disorder (HCC) Diagnosis:   Patient Active Problem List   Diagnosis Date Noted  . HTN (hypertension) [I10] 10/26/2016  . Dyslipidemia [E78.5] 10/26/2016  . GERD (gastroesophageal reflux disease) [K21.9] 10/26/2016  . Hypoglycemia [E16.2] 10/25/2016  . PTSD (post-traumatic stress disorder) [F43.10] 10/25/2016  . Suicide attempt [T14.91XA] 10/25/2016  . Episodic mood disorder (HCC) [F39] 10/25/2016   Subjective Data: suicide attempt.  Continued Clinical Symptoms:  Alcohol Use Disorder Identification Test Final Score (AUDIT): 0 The "Alcohol Use Disorders Identification Test", Guidelines for Use in Primary Care, Second Edition.  World Science writerHealth Organization [facility](WHO). Score between 0-7:  no or low risk or alcohol related problems. Score between 8-15:  moderate risk of alcohol related problems. Score between 16-19:  high risk of alcohol related problems. Score 20 or above:  warrants further diagnostic evaluation for alcohol dependence and treatment.   CLINICAL FACTORS:   Severe Anxiety and/or Agitation Depression:   Impulsivity Medical Diagnoses and Treatments/Surgeries   Musculoskeletal: Strength & Muscle Tone: within normal limits Gait & Station: normal Patient leans: N/A  Psychiatric Specialty Exam: Physical Exam  Nursing note and vitals reviewed. Psychiatric: Gerald Ponce speech is normal and behavior is normal. Gerald Ponce mood appears anxious. Gerald Ponce affect is labile. Cognition and memory are normal. He expresses impulsivity. He expresses suicidal ideation. He expresses suicidal plans.    Review of Systems  Psychiatric/Behavioral: Positive for depression and suicidal ideas. The patient is nervous/anxious.   All other systems  reviewed and are negative.   Blood pressure (!) 158/89, pulse 86, temperature 98.3 F (36.8 C), temperature source Oral, resp. rate 18, height 5\' 10"  (1.778 m), weight 88.9 kg (196 lb), SpO2 99 %.Body mass index is 28.12 kg/m.  General Appearance: Casual  Eye Contact:  Good  Speech:  Clear and Coherent  Volume:  Normal  Mood:  Euphoric  Affect:  Congruent and Tearful  Thought Process:  Goal Directed and Descriptions of Associations: Intact  Orientation:  Full (Time, Place, and Person)  Thought Content:  WDL  Suicidal Thoughts:  No  Homicidal Thoughts:  No  Memory:  Immediate;   Fair Recent;   Fair Remote;   Fair  Judgement:  Poor  Insight:  Lacking  Psychomotor Activity:  Normal  Concentration:  Concentration: Fair and Attention Span: Fair  Recall:  FiservFair  Fund of Knowledge:  Fair  Language:  Fair  Akathisia:  No  Handed:  Right  AIMS (if indicated):     Assets:  Communication Skills Desire for Improvement Financial Resources/Insurance Housing Physical Health Resilience Social Support  ADL's:  Intact  Cognition:  WNL  Sleep:         COGNITIVE FEATURES THAT CONTRIBUTE TO RISK:  None    SUICIDE RISK:   Moderate:  Frequent suicidal ideation with limited intensity, and duration, some specificity in terms of plans, no associated intent, good self-control, limited dysphoria/symptomatology, some risk factors present, and identifiable protective factors, including available and accessible social support.  PLAN OF CARE: Hospital admission, medication management, discharge planning.  Gerald Ponce is a 66 year old male with a history of depression and PTSD transfer from medical floor when he was briefly hospitalized after insulin overdose in the context of severe social  stressors.  1. Suicidal ideation. The patient is able to contract for safety in the hospital.  2. Mood. We will increase Effexor 250 mg for depression.  3. PTSD. We will continue Minipress at bedtime for  nightmares.  4. Diabetes. He is on metformin, sliding scale insulin, ADA diet, and blood glucose monitoring. Per diabetes nurse coordinator we will start 4 units of Novolog with meals. The patient claims to take insulin glargine and aspart 35/25 unit in am, 20/25 units at bedtime.  5. Dyslipidemia. He is on atorvastatin.  6.  Hypertension. He is on diltiazem and lisinopril.   7. GERD. He is on Protonix.   8. Diabetic neuropathy. He is on Neurontin.  9. Vitamin D deficiency. He is on V3 supplementation.  10. Disposition. He will be discharged to home. He will follow up with the [place].  I certify that inpatient services furnished can reasonably be expected to improve the patient's condition.   Kristine Linea, MD 10/26/2016, 3:43 PM

## 2016-10-26 NOTE — Progress Notes (Signed)
[facility]. No discharge paperwork given to patient because of  Epic functionality. Report given to Gigi in unit. Patient will escort with nurse and security guard to unit. Patient and family member has talked with nursing supervisor Annice PihJackie and unit director Vida RollerShannon Wineman about [facility]. All instructions given and questions answered.

## 2016-10-26 NOTE — Progress Notes (Signed)
Pt. Shared with Chaplain how he has been overwhelmed by taking care of his wife who has dementia. Pt. has two cases in court which are other stress factors. Pt. Is an active member of a [employer] [place]. Pt. Attempted to end his life when he felt overwhelmed. Chaplain encouraged Pt. To reach out for a help when life gets tough and he can't cope. Furthermore, Chaplain helped patient to understand that life may bring some challenges, but is worth living. Chaplain prayed with patient.

## 2016-10-26 NOTE — Discharge Summary (Signed)
[facility] [place] at [facility], 65 y.o., DOB 1951/08/25, MRN [id number]. Admission date: 10/25/2016 Discharge Date 10/26/2016 Primary MD Pcp Not In System Admitting Physician Enedina Finner, MD  Admission Diagnosis  Elevated troponin I level [R74.8] Involuntary commitment [Z04.6] Insulin overdose, intentional self-harm, initial encounter [facility]) [T38.3X2A]  Discharge Diagnosis   Principal Problem:   Suicide attempt   Hypoglycemia   PTSD (post-traumatic stress disorder)   Episodic mood disorder [facility])          Hospital Course  Gerald Ponce  is a 66 y.o. male with a known history of PTSD, diabetes, hypertension, CAD status post stent in the remote past comes to the emergency room after he was found unresponsive by patient's family member. Patient reportedly took several doses off unknown amount of insulin NovoLog in attempt for suicidal ideation and killing himself. Pt admitted and started on d10 bg now stable.  Pt seen by psychiatry recommends inpatient psychiatry therapy.            Consults  psychiatry  Significant Tests:  See full reports for all details    Dg Chest 1 View  Result Date: 10/25/2016 CLINICAL DATA:  Possible overdose this morning. EXAM: CHEST 1 VIEW COMPARISON:  None. FINDINGS: Low volume chest with interstitial crowding. There is no edema, consolidation, effusion, or pneumothorax. Normal heart size and mediastinal contours. Coronary stent noted. IMPRESSION: Low volume portable chest.  No acute finding. Electronically Signed   By: Marnee Spring M.D.   On: 10/25/2016 13:20       Today   Subjective:   Gerald Ponce  Feels better no complatins  Objective:   Blood pressure (!) 188/67, pulse 66, temperature 98.4 F (36.9 C), temperature source Oral, resp. rate 20, height 5\' 10"  (1.778 m), weight 180 lb (81.6 kg), SpO2 100 %.  .  Intake/Output Summary (Last 24 hours) at 10/26/16 1257 Last data filed at 10/26/16  1110  Gross per 24 hour  Intake          2591.67 ml  Output                0 ml  Net          2591.67 ml    Exam VITAL SIGNS: Blood pressure (!) 188/67, pulse 66, temperature 98.4 F (36.9 C), temperature source Oral, resp. rate 20, height 5\' 10"  (1.778 m), weight 180 lb (81.6 kg), SpO2 100 %.  GENERAL:  66 y.o.-year-old patient lying in the bed with no acute distress.  EYES: Pupils equal, round, reactive to light and accommodation. No scleral icterus. Extraocular muscles intact.  HEENT: Head atraumatic, normocephalic. Oropharynx and nasopharynx clear.  NECK:  Supple, no jugular venous distention. No thyroid enlargement, no tenderness.  LUNGS: Normal breath sounds bilaterally, no wheezing, rales,rhonchi or crepitation. No use of accessory muscles of respiration.  CARDIOVASCULAR: S1, S2 normal. No murmurs, rubs, or gallops.  ABDOMEN: Soft, nontender, nondistended. Bowel sounds present. No organomegaly or mass.  EXTREMITIES: No pedal edema, cyanosis, or clubbing.  NEUROLOGIC: Cranial nerves II through XII are intact. Muscle strength 5/5 in all extremities. Sensation intact. Gait not checked.  PSYCHIATRIC: The patient is alert and oriented x 3.  SKIN: No obvious rash, lesion, or ulcer.   Data Review     CBC w Diff: Lab Results  Component Value Date   WBC 5.9 10/25/2016   HGB 13.0 10/25/2016   HCT 36.1 (L) 10/25/2016   PLT 221 10/25/2016   LYMPHOPCT 32  10/25/2016   MONOPCT 9 10/25/2016   EOSPCT 5 10/25/2016   BASOPCT 1 10/25/2016   CMP: Lab Results  Component Value Date   NA 141 10/25/2016   K 3.5 10/25/2016   CL 108 10/25/2016   CO2 25 10/25/2016   BUN 20 10/25/2016   CREATININE 0.75 10/25/2016   PROT 6.9 10/25/2016   ALBUMIN 3.3 (L) 10/25/2016   BILITOT 0.6 10/25/2016   ALKPHOS 88 10/25/2016   AST 59 (H) 10/25/2016   ALT 59 10/25/2016  .  Micro Results No results found for this or any previous visit (from the past 240 hour(s)).      Code Status Orders         Start     Ordered   10/25/16 1545  Full code  Continuous     10/25/16 1545    Code Status History    Date Active Date Inactive Code Status Order ID Comments User Context   This patient has a current code status but no historical code status.          Follow-up Information    pcp Follow up in 7 day(s).           Discharge Medications   Allergies as of 10/26/2016      Reactions   Fenofibrate Other (See Comments)   "kidney shut down"      Medication List    TAKE these medications   aspirin 81 MG EC tablet Take 1 tablet (81 mg total) by mouth daily. Start taking on:  10/27/2016   metFORMIN 1000 MG tablet Commonly known as:  GLUCOPHAGE Take 500 mg by mouth 2 (two) times daily with a meal.   omeprazole 20 MG capsule Commonly known as:  PRILOSEC Take 20 mg by mouth daily.          Total Time in preparing paper work, data evaluation and todays exam - 35 minutes  Auburn BilberryPATEL, Eliza Grissinger M.D on 10/26/2016 at 12:57 PM  [facility]   Office  [phone]

## 2016-10-26 NOTE — Tx Team (Signed)
Initial Treatment Plan 10/26/2016 4:36 PM Gerald HilaJames Perine LKG:[id number]    PATIENT STRESSORS: Legal issue Marital or family conflict   PATIENT STRENGTHS: Ability for insight Active sense of humor Average or above average intelligence Capable of independent living Communication skills   PATIENT IDENTIFIED PROBLEMS: Suicidal attempt 10/26/2016  PTSD 10/26/2016                   DISCHARGE CRITERIA:  Ability to meet basic life and health needs Adequate post-discharge living arrangements Verbal commitment to aftercare and medication compliance  PRELIMINARY DISCHARGE PLAN: Attend aftercare/continuing care group Return to previous living arrangement  PATIENT/FAMILY INVOLVEMENT: This treatment plan has been presented to and reviewed with the patient, Gerald Ponce, and/or family member, .  The patient and family have been given the opportunity to ask questions and make suggestions.  [name], RN 10/26/2016, 4:36 PM

## 2016-10-26 NOTE — Clinical Social Work Note (Signed)
CSW consulted for discharge planning needs and intentional overdose. Pt has been seen by psychiatry and the plan is for pt to admit to BMU when medically cleared. Attending has cleared pt for discharge to BMU. The plan is for pt to admit later today. CSW is signing off as no further needs identified.   Dede QuerySarah Diondre Pulis, MSW, LCSW  Clinical Social Worker  [phone]

## 2016-10-26 NOTE — Progress Notes (Signed)
Contacted to speak to patient and daughter. Patient stated it was okay to speak about his medical condition with his daughter present. They both wanted him to be discharged home to her care. Spoke with Dr. Toni Amendlapacs, he stated he would not discharge him and he needed inpatient psychiatric care. He stated it would be much later in the day before he could speak with them and he needed to go to the Behavioral Medicine Unit. The daughter and the patient stated they wanted to sign out. Explained he was involuntarily committed and what it meant.

## 2016-10-27 DIAGNOSIS — F39 Unspecified mood [affective] disorder: Principal | ICD-10-CM

## 2016-10-27 LAB — GLUCOSE, CAPILLARY
GLUCOSE-CAPILLARY: 196 mg/dL — AB (ref 65–99)
GLUCOSE-CAPILLARY: 190 mg/dL — AB (ref 65–99)
Glucose-Capillary: 335 mg/dL — ABNORMAL HIGH (ref 65–99)
Glucose-Capillary: 229 mg/dL — ABNORMAL HIGH (ref 65–99)

## 2016-10-27 LAB — LIPID PANEL
CHOL/HDL RATIO: 3 ratio
CHOLESTEROL: 146 mg/dL (ref 0–200)
HDL: 49 mg/dL (ref >40)
LDL Cholesterol: 47 mg/dL (ref 0–99)
Triglycerides: 252 mg/dL — ABNORMAL HIGH (ref <150)
VLDL: 50 mg/dL — AB (ref 0–40)

## 2016-10-27 LAB — TSH: TSH: 1.734 u[IU]/mL (ref 0.350–4.500)

## 2016-10-27 MED ORDER — DILTIAZEM HCL ER COATED BEADS 120 MG PO CP24
120.0000 mg | ORAL_CAPSULE | Freq: Every day | ORAL | Status: DC
  Administered 2016-10-28 – 2016-10-29 (×2): 120 mg via ORAL
  Filled 2016-10-27 (×2): qty 1

## 2016-10-27 MED ORDER — ARIPIPRAZOLE 5 MG PO TABS
5.0000 mg | ORAL_TABLET | Freq: Every day | ORAL | Status: DC
  Administered 2016-10-27 – 2016-10-29 (×3): 5 mg via ORAL
  Filled 2016-10-27 (×4): qty 1

## 2016-10-27 MED ORDER — INSULIN ASPART 100 UNIT/ML ~~LOC~~ SOLN
8.0000 [IU] | Freq: Three times a day (TID) | SUBCUTANEOUS | Status: DC
  Administered 2016-10-28 – 2016-10-29 (×5): 8 [IU] via SUBCUTANEOUS
  Filled 2016-10-27 (×3): qty 8

## 2016-10-27 NOTE — BHH Group Notes (Signed)
[facility] Group Notes:  (Nursing/MHT/Case Management/Adjunct)  Date:  10/27/2016  Time:  5:03 PM  Type of Therapy:  Psychoeducational Skills  Participation Level:  Did Not Attend  Twanna Hymanda C Shalawn Wynder 10/27/2016, 5:03 PM

## 2016-10-27 NOTE — Progress Notes (Signed)
Patient's BP 82/48 HR 72.  Patient complaining of dizziness.  Spoke with Dr Ardyth HarpsHernandez.  She ordered to discontinue the Minipress and push fluids.  Patient escorted to room back to bed by staff.  Patient given gatorade and encouraged to drink.

## 2016-10-27 NOTE — Plan of Care (Signed)
Problem: Coping: Goal: Ability to verbalize frustrations and anger appropriately will improve Outcome: Progressing Working on Licensed conveyancercoping  Skills  Handout given

## 2016-10-27 NOTE — H&P (Signed)
Psychiatric Admission Assessment Adult  Patient Identification: Ronnette HilaJames Fill MRN:  [id number] Date of Evaluation:  10/27/2016 Chief Complaint:  Major Depression Principal Diagnosis: Episodic mood disorder (HCC) Diagnosis:   Patient Active Problem List   Diagnosis Date Noted  . HTN (hypertension) [I10] 10/26/2016  . Dyslipidemia [E78.5] 10/26/2016  . GERD (gastroesophageal reflux disease) [K21.9] 10/26/2016  . Hypoglycemia [E16.2] 10/25/2016  . PTSD (post-traumatic stress disorder) [F43.10] 10/25/2016  . Suicide attempt [T14.91XA] 10/25/2016  . Episodic mood disorder (HCC) [F39] 10/25/2016   History of Present Illness:   Identifying data. [name] is a 66 year old male with history of depression and anxiety.  Chief complaint. "I'm just tired."  History of present illness. Information was obtained from the patient and the chart. The patient has a long history of PTSD and some depression. This has been treated at the [facility] with a combination of Effexor and Minipress with much success. For the past 4 months the patient has been under enormous stress. His wife who suffers dementia was removed from the house by Adult Protective Services and placed in dementia unit. The patient is not allowed to visit with her as she gets agitated every time she sees him. There are also legal charges and the patient is to appear in court several times in March. His first court date is on March 1, next on March 7. He does not have a Clinical research associatelawyer and has absolutely no idea how things will end up. He feels stressed out and overwhelmed but denies depressive symptoms. On the interview he was very pleasant polite and cooperated and rather tearful when talking about his wife or his troubles. He was admitted to medical floor after intentional overdose on insulin. He denies planning this suicide but as it was happening he kept everything more and more insulin and he didn't care whether he lives or dies. He was at peace with God  and ready to accept whatever God's verdict. As he survived, he is now happy to be alive. During his ordeal he developed a strong on with his stepdaughter, decided to tough out legal process, and [place] [facility] [place] [facility] is [place]. Per Dr. Toni Amendlapacs' notes, who knows him better, the patient often presents with symptoms suggestive of hypomania. He really denied any current or past symptoms. He only made a comment that some things upset him more than other people. He denies PTSD symptoms as he is treated with Minipress. He denies alcohol or illicit substance use.  Past psychiatric history. He was diagnosed with PTSD at the [facility] and is taking Minipress. [place]. He was hospitalized one time at the [facility] in December 2017 again in the face of severe stressors. There were no suicide attempts.  Family psychiatric history. Nonreported.  Social history. He now lives at home. His wife was placed on the memory. He has an adopted daughter with whom he is close. His self in the [employer] and has for [facility] benefits. He receives [employer] and Companymilitary disability. There are serious legal charges pending including spousal abuse and neglect.  Total Time spent with patient: 1 hour  Is the patient at risk to self? Yes.    Has the patient been a risk to self in the past 6 months? No.  Has the patient been a risk to self within the distant past? No.  Is the patient a risk to others? No.  Has the patient been a risk to others in the past 6 months? No.  Has the patient been a  risk to others within the distant past? No.   Prior Inpatient Therapy:   Prior Outpatient Therapy:    Alcohol Screening: 1. How often do you have a drink containing alcohol?: Never 2. How many drinks containing alcohol do you have on a typical day when you are drinking?: 1 or 2 3. How often do you have six or more drinks on one occasion?: Never Preliminary Score: 0 4. How often during the last year have you found that you were not able to stop drinking  once you had started?: Never 5. How often during the last year have you failed to do what was normally expected from you becasue of drinking?: Never 6. How often during the last year have you needed a first drink in the morning to get yourself going after a heavy drinking session?: Never 7. How often during the last year have you had a feeling of guilt of remorse after drinking?: Never 8. How often during the last year have you been unable to remember what happened the night before because you had been drinking?: Never 9. Have you or someone else been injured as a result of your drinking?: No 10. Has a relative or friend or a doctor or another health worker been concerned about your drinking or suggested you cut down?: No Alcohol Use Disorder Identification Test Final Score (AUDIT): 0 Brief Intervention: AUDIT score less than 7 or less-screening does not suggest unhealthy drinking-brief intervention not indicated Substance Abuse History in the last 12 months:  No. Consequences of Substance Abuse: NA Previous Psychotropic Medications: Yes  Psychological Evaluations: No  Past Medical History:  Past Medical History:  Diagnosis Date  . Diabetes mellitus without complication (HCC)   . Hypertension   . PTSD (post-traumatic stress disorder)     Past Surgical History:  Procedure Laterality Date  . BACK SURGERY    . HERNIA REPAIR     Family History: History reviewed. No pertinent family history.  Tobacco Screening: Have you used any form of tobacco in the last 30 days? (Cigarettes, Smokeless Tobacco, Cigars, and/or Pipes): No Social History:  History  Alcohol Use No     History  Drug Use No    Additional Social History:                           Allergies:   Allergies  Allergen Reactions  . Fenofibrate Other (See Comments)    "kidney shut down"   Lab Results:  Results for orders placed or performed during the hospital encounter of 10/26/16 (from the past 48 hour(s))   Glucose, capillary     Status: Abnormal   Collection Time: 10/26/16  5:01 PM  Result Value Ref Range   Glucose-Capillary 247 (H) 65 - 99 mg/dL  Glucose, capillary     Status: Abnormal   Collection Time: 10/26/16 10:14 PM  Result Value Ref Range   Glucose-Capillary 141 (H) 65 - 99 mg/dL  Glucose, capillary     Status: Abnormal   Collection Time: 10/27/16  7:03 AM  Result Value Ref Range   Glucose-Capillary 196 (H) 65 - 99 mg/dL    Blood Alcohol level:  Lab Results  Component Value Date   ETH <5 10/25/2016   ETH <5 08/26/2016    Metabolic Disorder Labs:  No results found for: HGBA1C, MPG No results found for: PROLACTIN No results found for: CHOL, TRIG, HDL, CHOLHDL, VLDL, LDLCALC  Current Medications: Current Facility-Administered Medications  Medication  Dose Route Frequency Provider Last Rate Last Dose  . acetaminophen (TYLENOL) tablet 650 mg  650 mg Oral Q6H PRN Hebah Bogosian B Ahsha Hinsley, MD      . alum & mag hydroxide-simeth (MAALOX/MYLANTA) 200-200-20 MG/5ML suspension 30 mL  30 mL Oral Q4H PRN Tanmay Halteman B Zaydrian Batta, MD      . aspirin EC tablet 81 mg  81 mg Oral Daily Fue Cervenka B Ruhaan Nordahl, MD   81 mg at 10/26/16 1703  . atorvastatin (LIPITOR) tablet 80 mg  80 mg Oral q1800 Cooper Moroney B Ashtin Melichar, MD   80 mg at 10/26/16 1703  . cholecalciferol (VITAMIN D) tablet 1,000 Units  1,000 Units Oral Daily Shari Prows, MD   1,000 Units at 10/26/16 1703  . diltiazem (CARDIZEM CD) 24 hr capsule 120 mg  120 mg Oral Daily Birgitta Uhlir B Helton Oleson, MD   120 mg at 10/26/16 1703  . gabapentin (NEURONTIN) capsule 300 mg  300 mg Oral BID Shari Prows, MD   300 mg at 10/26/16 1703  . hydrOXYzine (ATARAX/VISTARIL) tablet 50 mg  50 mg Oral TID PRN Diksha Tagliaferro B Laden Fieldhouse, MD      . insulin aspart (novoLOG) injection 0-15 Units  0-15 Units Subcutaneous TID WC Shari Prows, MD   5 Units at 10/26/16 1704  . insulin aspart (novoLOG) injection 0-5 Units  0-5 Units Subcutaneous QHS Bhavesh Vazquez B  Sallyanne Birkhead, MD      . insulin aspart (novoLOG) injection 4 Units  4 Units Subcutaneous TID WC Shari Prows, MD   4 Units at 10/26/16 1704  . lisinopril (PRINIVIL,ZESTRIL) tablet 40 mg  40 mg Oral Daily Traniece Boffa B Amarah Brossman, MD   40 mg at 10/26/16 1702  . magnesium hydroxide (MILK OF MAGNESIA) suspension 30 mL  30 mL Oral Daily PRN Azhane Eckart B Lashya Passe, MD      . metFORMIN (GLUCOPHAGE) tablet 500 mg  500 mg Oral BID WC Dontrel Smethers B Torrance Frech, MD   500 mg at 10/26/16 1703  . pantoprazole (PROTONIX) EC tablet 40 mg  40 mg Oral Daily Willadean Guyton B David Towson, MD   40 mg at 10/26/16 1702  . venlafaxine XR (EFFEXOR-XR) 24 hr capsule 150 mg  150 mg Oral Q breakfast Tenasia Aull B Labrian Torregrossa, MD       PTA Medications: Prescriptions Prior to Admission  Medication Sig Dispense Refill Last Dose  . metFORMIN (GLUCOPHAGE) 1000 MG tablet Take 500 mg by mouth 2 (two) times daily with a meal.   10/25/2016 at 0800  . omeprazole (PRILOSEC) 20 MG capsule Take 20 mg by mouth daily.   10/25/2016 at 0800    Musculoskeletal: Strength & Muscle Tone: within normal limits Gait & Station: normal Patient leans: N/A  Psychiatric Specialty Exam: I reviewed physical examination performed on the medical floor and agree with her findings. Physical Exam  Nursing note and vitals reviewed. Psychiatric: His speech is normal and behavior is normal. His mood appears anxious. His affect is labile. Cognition and memory are normal. He expresses impulsivity. He expresses suicidal ideation. He expresses suicidal plans.    Review of Systems  Psychiatric/Behavioral: Positive for depression and suicidal ideas. The patient is nervous/anxious.   All other systems reviewed and are negative.   Blood pressure (!) 112/50, pulse (!) 59, temperature 98 F (36.7 C), resp. rate 18, height 5\' 10"  (1.778 m), weight 88.9 kg (196 lb), SpO2 99 %.Body mass index is 28.12 kg/m.  See SRA.  Sleep:  Number of Hours: 8.15    Treatment Plan Summary: Daily contact with patient to assess and evaluate symptoms and progress in treatment and Medication management   Mr. Albee is a 66 year old male with a history of depression and PTSD transfer from medical floor when he was briefly hospitalized after insulin overdose in the context of severe social stressors.  1. Suicidal ideation. The patient is able to contract for safety in the hospital.  2. Mood. We will increase Effexor to 150 mg for depression.  3. PTSD. We will continue Minipress at bedtime for nightmares.  4. Diabetes. He is on metformin, ADA diet, sliding scale insulin and blood glucose monitoring. Per diabetes nurse coordinator recommendation, we started NovoLog 4 units before meals. The patient claims to take insulin glargine and aspart 35/25 unit in am, 20/25 units at bedtime.  5. Dyslipidemia. He is on atorvastatin.  6.  Hypertension. He is on diltiazem and lisinopril.   7. GERD. He is on Protonix.   8. Diabetic neuropathy. He is on Neurontin.  9. Vitamin D deficiency. He is on V3 supplementation.  10. Disposition. He will be discharged to home. He will follow up with the [place]   Observation Level/Precautions:  15 minute checks  Laboratory:  CBC Chemistry Profile UDS UA  Psychotherapy:    Medications:    Consultations:    Discharge Concerns:    Estimated LOS:  Other:     Physician Treatment Plan for Primary Diagnosis: Episodic mood disorder (HCC) Long Term Goal(s): Improvement in symptoms so as ready for discharge  Short Term Goals: Ability to identify changes in lifestyle to reduce recurrence of condition will improve, Ability to verbalize feelings will improve, Ability to disclose and discuss suicidal ideas, Ability to demonstrate self-control will improve, Ability to identify and develop effective coping behaviors will improve, Ability to maintain clinical measurements within normal limits  will improve, Compliance with prescribed medications will improve and Ability to identify triggers associated with substance abuse/mental health issues will improve  Physician Treatment Plan for Secondary Diagnosis: Principal Problem:   Episodic mood disorder (HCC) Active Problems:   PTSD (post-traumatic stress disorder)   Suicide attempt   HTN (hypertension)   Dyslipidemia   GERD (gastroesophageal reflux disease)  Long Term Goal(s): NA  Short Term Goals: NA  I certify that inpatient services furnished can reasonably be expected to improve the patient's condition.    Kristine Linea, MD 2/28/20187:32 AM

## 2016-10-27 NOTE — Progress Notes (Signed)
Recreation Therapy Notes  INPATIENT RECREATION THERAPY ASSESSMENT  Patient Details Name: Gerald Ponce MRN: [id number] DOB: 1951/02/27 Today's Date: 10/27/2016  Patient Stressors: Family, Friends, Other (Comment) (Family has nothing to do with him; lack of supportive friends; bills, finance trouble, social services has removed his wife from the home and put her in a nursing home - stated the pt was not taking care of her)  Coping Skills:   Isolate, Arguments, Avoidance, Talking, Music, Sports, Other (Comment) (Rest and calm down)  Personal Challenges: Anger, Communication, Decision-Making, Expressing Yourself, Relationships, Stress Management, Trusting Others  Leisure Interests (2+):  Music - Listen, Individual - Other (Comment) (Yard work)  Biochemist, clinicalAwareness of Community Resources:  Yes  Community Resources:  Recreation Center  Current Use: No  If no, Barriers?:    Patient Strengths:  Take care of himself, independent  Patient Identified Areas of Improvement:  Learn how to communicate better  Current Recreation Participation:  Nothing  Patient Goal for Hospitalization:  Patient does not have a goal at this time  [place]  County [place] Residence:  [place]   Current SI (including self-harm):  No  Current HI:  No  Consent to Intern Participation: N/A   Jacquelynn CreeGreene,Lotoya Casella M, LRT/CTRS 10/27/2016, 2:26 PM

## 2016-10-27 NOTE — Progress Notes (Signed)
D:Patient voice of light headedness this am  Blood pressure 82/48 Patient remained in bed during breakfast,  Affect cheerful on approach  Continue to wear scrubs . No ADL's preformed  This am shift . Appetite good at  Breakfast.  Patient  Able to walk to lunch and back . Voice no concerns  Patient stated slept poor ast night .Stated appetite is good and energy levellow. Stated concentration is good . Stated on Depression scale  6, hopeless 0 and anxiety 5 .( low 0-10 high) Denies suicidal  homicidal ideations  .  No auditory hallucinations  No pain concerns . Appropriate ADL'S. Interacting with peers and staff.  Patient working on Pharmacologistcoping skills. Voice  Of learning to control his mood swing  A: Encourage patient participation with unit programming . Instruction  Given on  Medication , verbalize understanding. R: Voice no other concerns. Staff continue to monitor

## 2016-10-27 NOTE — Tx Team (Signed)
Interdisciplinary Treatment and Diagnostic Plan Update  10/27/2016 Time of Session: 1050 Gerald Ponce MRN: [id number]  Principal Diagnosis: Episodic mood disorder (HCC)  Secondary Diagnoses: Principal Problem:   Episodic mood disorder (HCC) Active Problems:   PTSD (post-traumatic stress disorder)   Suicide attempt   HTN (hypertension)   Dyslipidemia   GERD (gastroesophageal reflux disease)   Current Medications:  Current Facility-Administered Medications  Medication Dose Route Frequency Provider Last Rate Last Dose  . acetaminophen (TYLENOL) tablet 650 mg  650 mg Oral Q6H PRN Jolanta B Pucilowska, MD      . alum & mag hydroxide-simeth (MAALOX/MYLANTA) 200-200-20 MG/5ML suspension 30 mL  30 mL Oral Q4H PRN Jolanta B Pucilowska, MD      . ARIPiprazole (ABILIFY) tablet 5 mg  5 mg Oral Daily Jolanta B Pucilowska, MD   5 mg at 10/27/16 1421  . aspirin EC tablet 81 mg  81 mg Oral Daily Jolanta B Pucilowska, MD   81 mg at 10/27/16 0820  . atorvastatin (LIPITOR) tablet 80 mg  80 mg Oral q1800 Jolanta B Pucilowska, MD   80 mg at 10/26/16 1703  . cholecalciferol (VITAMIN D) tablet 1,000 Units  1,000 Units Oral Daily Shari Prows, MD   1,000 Units at 10/27/16 0821  . [START ON 10/28/2016] diltiazem (CARDIZEM CD) 24 hr capsule 120 mg  120 mg Oral Daily Jolanta B Pucilowska, MD      . gabapentin (NEURONTIN) capsule 300 mg  300 mg Oral BID Jolanta B Pucilowska, MD   300 mg at 10/27/16 0820  . hydrOXYzine (ATARAX/VISTARIL) tablet 50 mg  50 mg Oral TID PRN Jolanta B Pucilowska, MD      . insulin aspart (novoLOG) injection 0-15 Units  0-15 Units Subcutaneous TID WC Shari Prows, MD   11 Units at 10/27/16 1142  . insulin aspart (novoLOG) injection 0-5 Units  0-5 Units Subcutaneous QHS Jolanta B Pucilowska, MD      . insulin aspart (novoLOG) injection 4 Units  4 Units Subcutaneous TID WC Shari Prows, MD   4 Units at 10/27/16 1141  . magnesium hydroxide (MILK OF MAGNESIA)  suspension 30 mL  30 mL Oral Daily PRN Jolanta B Pucilowska, MD      . metFORMIN (GLUCOPHAGE) tablet 500 mg  500 mg Oral BID WC Jolanta B Pucilowska, MD   500 mg at 10/27/16 0820  . pantoprazole (PROTONIX) EC tablet 40 mg  40 mg Oral Daily Jolanta B Pucilowska, MD   40 mg at 10/27/16 0820  . venlafaxine XR (EFFEXOR-XR) 24 hr capsule 150 mg  150 mg Oral Q breakfast Jolanta B Pucilowska, MD   150 mg at 10/27/16 0820   PTA Medications: Prescriptions Prior to Admission  Medication Sig Dispense Refill Last Dose  . metFORMIN (GLUCOPHAGE) 1000 MG tablet Take 500 mg by mouth 2 (two) times daily with a meal.   10/25/2016 at 0800  . omeprazole (PRILOSEC) 20 MG capsule Take 20 mg by mouth daily.   10/25/2016 at 0800    Patient Stressors: Legal issue Marital or family conflict  Patient Strengths: Ability for insight Active sense of humor Average or above average intelligence Capable of independent living Communication skills  Treatment Modalities: Medication Management, Group therapy, Case management,  1 to 1 session with clinician, Psychoeducation, Recreational therapy.   Physician Treatment Plan for Primary Diagnosis: Episodic mood disorder (HCC) Long Term Goal(s): Improvement in symptoms so as ready for discharge NA   Short Term Goals: Ability to identify changes in  lifestyle to reduce recurrence of condition will improve Ability to verbalize feelings will improve Ability to disclose and discuss suicidal ideas Ability to demonstrate self-control will improve Ability to identify and develop effective coping behaviors will improve Ability to maintain clinical measurements within normal limits will improve Compliance with prescribed medications will improve Ability to identify triggers associated with substance abuse/mental health issues will improve NA  Medication Management: Evaluate patient's response, side effects, and tolerance of medication regimen.  Therapeutic Interventions: 1 to 1  sessions, Unit Group sessions and Medication administration.  Evaluation of Outcomes: Progressing  Physician Treatment Plan for Secondary Diagnosis: Principal Problem:   Episodic mood disorder (HCC) Active Problems:   PTSD (post-traumatic stress disorder)   Suicide attempt   HTN (hypertension)   Dyslipidemia   GERD (gastroesophageal reflux disease)  Long Term Goal(s): Improvement in symptoms so as ready for discharge NA   Short Term Goals: Ability to identify changes in lifestyle to reduce recurrence of condition will improve Ability to verbalize feelings will improve Ability to disclose and discuss suicidal ideas Ability to demonstrate self-control will improve Ability to identify and develop effective coping behaviors will improve Ability to maintain clinical measurements within normal limits will improve Compliance with prescribed medications will improve Ability to identify triggers associated with substance abuse/mental health issues will improve NA     Medication Management: Evaluate patient's response, side effects, and tolerance of medication regimen.  Therapeutic Interventions: 1 to 1 sessions, Unit Group sessions and Medication administration.  Evaluation of Outcomes: Progressing   RN Treatment Plan for Primary Diagnosis: Episodic mood disorder (HCC) Long Term Goal(s): Knowledge of disease and therapeutic regimen to maintain health will improve  Short Term Goals: Ability to remain free from injury will improve, Ability to verbalize frustration and anger appropriately will improve, Ability to verbalize feelings will improve, Ability to disclose and discuss suicidal ideas, Ability to identify and develop effective coping behaviors will improve and Compliance with prescribed medications will improve  Medication Management: RN will administer medications as ordered by provider, will assess and evaluate patient's response and provide education to patient for prescribed  medication. RN will report any adverse and/or side effects to prescribing provider.  Therapeutic Interventions: 1 on 1 counseling sessions, Psychoeducation, Medication administration, Evaluate responses to treatment, Monitor vital signs and CBGs as ordered, Perform/monitor CIWA, COWS, AIMS and Fall Risk screenings as ordered, Perform wound care treatments as ordered.  Evaluation of Outcomes: Progressing   LCSW Treatment Plan for Primary Diagnosis: Episodic mood disorder (HCC) Long Term Goal(s): Safe transition to appropriate next level of care at discharge, Engage patient in therapeutic group addressing interpersonal concerns.  Short Term Goals: Engage patient in aftercare planning with referrals and resources, Facilitate acceptance of mental health diagnosis and concerns and Increase skills for wellness and recovery  Therapeutic Interventions: Assess for all discharge needs, 1 to 1 time with Social worker, Explore available resources and support systems, Assess for adequacy in community support network, Educate family and significant other(s) on suicide prevention, Complete Psychosocial Assessment, Interpersonal group therapy.  Evaluation of Outcomes: Progressing   Progress in Treatment: Attending groups: No. Participating in groups: No. Taking medication as prescribed: Yes. Toleration medication: Yes. Family/Significant other contact made: Yes, individual(s) contacted:  friend Patient understands diagnosis: Yes. Discussing patient identified problems/goals with staff: Yes. Medical problems stabilized or resolved: Yes. Denies suicidal/homicidal ideation: Yes. Issues/concerns per patient self-inventory: No. Other: none  New problem(s) identified: No, Describe:  none  New Short Term/Long Term Goal(s): To find productive  things to do with my time.  Discharge Plan or Barriers: CSW assessing for appropriate plan.  Reason for Continuation of Hospitalization: Depression Medication  stabilization  Estimated Length of Stay:1-3 days.  Attendees: Patient: Gerald Ponce 10/27/2016   Physician: Dr. Jennet Maduro, MD 10/27/2016   Nursing: Leonia Reader, RN 10/27/2016   RN Care Manager: 10/27/2016   Social Worker: Daleen Squibb, LCSW 10/27/2016   Recreational Therapist: Hershal Coria, LRT/CTRS  10/27/2016   Other:  10/27/2016   Other:  10/27/2016   Other: 10/27/2016     Scribe for Treatment Team: Lorri Frederick, LCSW 10/27/2016 3:53 PM

## 2016-10-27 NOTE — [facility] Group Notes (Signed)
[facility]  10/27/2016 4:10 PM  Did not attend  Glennon MacSara P Rhea Kaelin 10/27/2016, 4:10 PM

## 2016-10-27 NOTE — Progress Notes (Signed)
Recreation Therapy Notes  Date: 02.28.18 Time: 1:00 pm Location: Craft Room  Group Topic: Self-esteem  Goal Area(s) Addresses:  Patient will be able to identify benefit of having healthy self-esteem. Patient will be able to identify ways to increase self-esteem.  Behavioral Response: Attentive, Interactive  Intervention: Self-Portrait  Activity: Patients were given blank face worksheets and were instructed to draw their self-portrait of how they were feeling. LRT collected drawings. Patients were given construction paper and were instructed to write a positive trait about self. Patient passed the papers around the room and they wrote positive traits about their peers. Patients were instructed to draw their self-portraits again based on how they felt after reading the comments from peers.  Education: LRT educated patients on ways they can increase their self-esteem.  Education Outcome: Acknowledges education/In group clarification offered  Clinical Observations/Feedback: Patient drew both self-portraits and wrote a positive trait about self and peers. Patient contributed to group discussion by stating how his faces were different, that he was surprised with what his peers wrote, how negative comments affects him, and what he can do to increase his self-esteem.  Jacquelynn CreeGreene,Donya Tomaro M, LRT/CTRS 10/27/2016 2:04 PM
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
## 2016-10-28 LAB — GLUCOSE, CAPILLARY
GLUCOSE-CAPILLARY: 141 mg/dL — AB (ref 65–99)
Glucose-Capillary: 217 mg/dL — ABNORMAL HIGH (ref 65–99)
Glucose-Capillary: 100 mg/dL — ABNORMAL HIGH (ref 65–99)
Glucose-Capillary: 186 mg/dL — ABNORMAL HIGH (ref 65–99)

## 2016-10-28 LAB — HEMOGLOBIN A1C
HEMOGLOBIN A1C: 9 % — AB (ref 4.8–5.6)
MEAN PLASMA GLUCOSE: 212 mg/dL

## 2016-10-28 MED ORDER — GABAPENTIN 300 MG PO CAPS: 300.0000 mg | ORAL_CAPSULE | Freq: Two times a day (BID) | ORAL | 1 refills | Status: AC

## 2016-10-28 MED ORDER — LISINOPRIL 20 MG PO TABS: 20.0000 mg | ORAL_TABLET | Freq: Every day | ORAL | 1 refills | Status: AC

## 2016-10-28 MED ORDER — CHOLECALCIFEROL 25 MCG (1000 UT) PO TABS: 1000.0000 [IU] | ORAL_TABLET | Freq: Every day | ORAL | 1 refills | Status: AC

## 2016-10-28 MED ORDER — ARIPIPRAZOLE 5 MG PO TABS: 5.0000 mg | ORAL_TABLET | Freq: Every day | ORAL | 1 refills | Status: DC

## 2016-10-28 MED ORDER — ATORVASTATIN CALCIUM 80 MG PO TABS: 80.0000 mg | ORAL_TABLET | Freq: Every day | ORAL | 1 refills | Status: AC

## 2016-10-28 MED ORDER — ASPIRIN 81 MG PO TBEC: 81.0000 mg | DELAYED_RELEASE_TABLET | Freq: Every day | ORAL | 1 refills | Status: AC

## 2016-10-28 MED ORDER — INSULIN GLARGINE 100 UNIT/ML ~~LOC~~ SOLN: 10.0000 [IU] | Freq: Every day | SUBCUTANEOUS | 11 refills | Status: DC

## 2016-10-28 MED ORDER — DILTIAZEM HCL ER COATED BEADS 120 MG PO CP24: 120.0000 mg | ORAL_CAPSULE | Freq: Every day | ORAL | 1 refills | Status: AC

## 2016-10-28 MED ORDER — LISINOPRIL 10 MG PO TABS
20.0000 mg | ORAL_TABLET | Freq: Every day | ORAL | Status: DC
  Administered 2016-10-28 – 2016-10-29 (×2): 20 mg via ORAL
  Filled 2016-10-28 (×2): qty 2

## 2016-10-28 MED ORDER — INSULIN ASPART 100 UNIT/ML ~~LOC~~ SOLN: 8.0000 [IU] | Freq: Three times a day (TID) | SUBCUTANEOUS | 11 refills | Status: DC

## 2016-10-28 MED ORDER — INSULIN GLARGINE 100 UNIT/ML ~~LOC~~ SOLN
10.0000 [IU] | Freq: Every day | SUBCUTANEOUS | Status: DC
  Administered 2016-10-28: 10 [IU] via SUBCUTANEOUS
  Filled 2016-10-28 (×2): qty 0.1

## 2016-10-28 MED ORDER — METFORMIN HCL 1000 MG PO TABS: 500.0000 mg | ORAL_TABLET | Freq: Two times a day (BID) | ORAL | 1 refills | Status: AC

## 2016-10-28 MED ORDER — OMEPRAZOLE 20 MG PO CPDR: 20.0000 mg | DELAYED_RELEASE_CAPSULE | Freq: Every day | ORAL | 1 refills | Status: AC

## 2016-10-28 MED ORDER — VENLAFAXINE HCL ER 150 MG PO CP24: 150.0000 mg | ORAL_CAPSULE | Freq: Every day | ORAL | 1 refills | Status: DC

## 2016-10-28 NOTE — Progress Notes (Signed)
D: Patient stated slept fair 0.last night .Stated appetite is good and energy level  Is normal. Stated concentration is good . Stated on Depression scale 2 , hopeless 0 and anxiety 0 .( low 0-10 high) Denies suicidal  homicidal ideations  .  No auditory hallucinations  No pain concerns . Appropriate ADL'S. Interacting with peers and staff.  Goal focus  " Attitude" A: Encourage patient participation with unit programming . Instruction  Given on  Medication , verbalize understanding. R: Voice no other concerns. Staff continue to monitor

## 2016-10-28 NOTE — [facility] Suicide Risk Assessment (Signed)
[facility]   Principal Problem: Episodic mood disorder [facility](HCC) Discharge Diagnoses:  Patient Active Problem List   Diagnosis Date Noted  . HTN (hypertension) [I10] 10/26/2016  . Dyslipidemia [E78.5] 10/26/2016  . GERD (gastroesophageal reflux disease) [K21.9] 10/26/2016  . Hypoglycemia [E16.2] 10/25/2016  . PTSD (post-traumatic stress disorder) [F43.10] 10/25/2016  . Suicide attempt [T14.91XA] 10/25/2016  . Episodic mood disorder (HCC) [F39] 10/25/2016    Total Time spent with patient: 30 minutes  Musculoskeletal: Strength & Muscle Tone: within normal limits Gait & Station: normal Patient leans: N/A  Psychiatric Specialty Exam: Review of Systems  All other systems reviewed and are negative.   Blood pressure (!) 150/75, pulse 88, temperature 99.3 F (37.4 C), temperature source Oral, resp. rate 20, height 5\' 10"  (1.778 m), weight 88.9 kg (196 lb), SpO2 97 %.Body mass index is 28.12 kg/m.  General Appearance: Casual  Eye Contact::  Good  Speech:  Clear and Coherent409  Volume:  Normal  Mood:  Euthymic  Affect:  Appropriate  Thought Process:  Goal Directed and Descriptions of Associations: Intact  Orientation:  Full (Time, Place, and Person)  Thought Content:  WDL  Suicidal Thoughts:  No  Homicidal Thoughts:  No  Memory:  Immediate;   Fair Recent;   Fair Remote;   Fair  Judgement:  Impaired  Insight:  Shallow  Psychomotor Activity:  Normal  Concentration:  Fair  Recall:  FiservFair  Fund of Knowledge:Fair  Language: Fair  Akathisia:  No  Handed:  Right  AIMS (if indicated):     Assets:  Communication Skills Desire for Improvement Financial Resources/Insurance Housing Physical Health Resilience Social Support Transportation  Sleep:  Number of Hours: 6.3  Cognition: WNL  ADL's:  Intact   Mental Status Per Nursing Assessment::   On Admission:     Demographic Factors:  Male, Age 66 or older, Divorced or widowed and Caucasian  Loss  Factors: Loss of significant relationship and Legal issues  Historical Factors: Prior suicide attempts, Family history of mental illness or substance abuse and Impulsivity  Risk Reduction Factors:   Sense of responsibility to family, Living with another person, especially a relative, Positive social support and Positive therapeutic relationship  Continued Clinical Symptoms:  Bipolar Disorder:   Depressive phase Depression:   Impulsivity  Cognitive Features That Contribute To Risk:  None    Suicide Risk:  Minimal: No identifiable suicidal ideation.  Patients presenting with no risk factors but with morbid ruminations; may be classified as minimal risk based on the severity of the depressive symptoms  [facility].   Specialty:  General Practice Why:  Please attend your appointment with Dr Lamar BenesHertzberg on 11/01/16 at 2pm.  Please bring a copy of your hospital discharge paperwork. Contact information: [address]  [facility] [place] [id number] [phone]           Plan Of Care/Follow-up recommendations:  Activity:  as tolerated. Diet:  low sodium heart healthy ADA diet. Other:  keep follow up appointment.  Kristine LineaJolanta Jenese Mischke, MD 10/28/2016, 6:35 PM

## 2016-10-28 NOTE — [facility] Group Notes (Signed)
[facility] LCSW Group Therapy  10/28/2016 11:17 AM  Type of Therapy:  Group Therapy  Participation Level:  Active  Participation Quality:  Attentive  Affect:  Appropriate  Cognitive:  Alert  Insight:  Improving  Engagement in Therapy:  Improving  Modes of Intervention:  Activity, Discussion, Education, Problem-solving, Reality Testing and Support  Summary of Progress/Problems: Balance in life: Patients will discuss the concept of balance and how it looks and feels to be unbalanced. Pt will identify areas in their life that is unbalanced and ways to become more balanced. They discussed what aspects in their lives has influenced their self care. Patients also discussed self care in the areas of self regulation/control, hygiene/appearance, sleep/relaxation, healthy leisure, healthy eating habits, exercise, inner peace/spirituality, self improvement, sobriety, and health management. They were challenged to identify changes that are needed in order to improve self care.   Adelis Docter G. Garnette CzechSampson MSW, LCSWA 10/28/2016, 11:17 AM

## 2016-10-28 NOTE — [facility] Group Notes (Signed)
[facility] Group Notes:  (Nursing/MHT/Case Management/Adjunct)  Date:  10/28/2016  Time:  1:15 AM  Type of Therapy:  Psychoeducational Skills  Participation Level:  Active  Participation Quality:  Appropriate  Affect:  Appropriate  Cognitive:  Alert  Insight:  Good  Engagement in Group:  Engaged  Modes of Intervention:  Activity  Summary of Progress/Problems:  Gerald NeerJackie L Deckard Ponce 10/28/2016, 1:15 AM

## 2016-10-28 NOTE — Plan of Care (Signed)
Problem: Coping: Goal: Ability to identify and develop effective coping behavior will improve Outcome: Progressing Continue toe work on Pharmacologistcoping skills

## 2016-10-28 NOTE — Progress Notes (Signed)
Inpatient Diabetes Program Recommendations  AACE/ADA: New Consensus Statement on Inpatient Glycemic Control (2015)  Target Ranges:  Prepandial:   less than 140 mg/dL      Peak postprandial:   less than 180 mg/dL (1-2 hours)      Critically ill patients:  140 - 180 mg/dL   Lab Results  Component Value Date   GLUCAP 141 (H) 10/28/2016   HGBA1C 9.0 (H) 10/27/2016   Results for [name], Gerald Ponce (MRN [id number]) as of 10/28/2016 10:29  Ref. Range 10/27/2016 07:03 10/27/2016 11:34 10/27/2016 16:34 10/27/2016 21:04 10/28/2016 06:44  Glucose-Capillary Latest Ref Range: 65 - 99 mg/dL 409196 (H) 811335 (H) 914229 (H) 190 (H) 141 (H)    Diabetes history: Type 2 Outpatient Diabetes medications: Metformin 500mg  bid Current orders for Inpatient glycemic control: Novolog 8 units tid, Metformin 500mg  bid, Novolog 0-15 units tid, Novolog 0-5 units qhs  Inpatient Diabetes Program Recommendations:  Consider adding low dose basal insulin, Lantus 10 units qhs.  Susette RacerJulie Xena Propst, RN, BA, MHA, CDE Diabetes Coordinator Inpatient Diabetes Program  [phone] (Team Pager) [phone] [facility] Office) 10/28/2016 10:33 AM

## 2016-10-28 NOTE — Plan of Care (Signed)
Problem: Activity: Goal: Sleeping patterns will improve Outcome: Progressing Patient slept for Estimated Hours of 6.30; every 15 minutes safety round maintained, no injury or falls during this shift.    

## 2016-10-28 NOTE — [facility] Group Notes (Signed)
[facility] LCSW Group Therapy Note  Type of Therapy and Topic:  Group Therapy:  Goals Group: SMART Goals  Participation Level:  Patient attended group on this date. Patient participated in goal setting and was able to share openly with the group.   Description of Group:   The purpose of a daily goals group is to assist and guide patients in setting recovery/wellness-related goals.  The objective is to set goals as they relate to the crisis in which they were admitted. Patients will be using SMART goal modalities to set measurable goals.  Characteristics of realistic goals will be discussed and patients will be assisted in setting and processing how one will reach their goal. Facilitator will also assist patients in applying interventions and coping skills learned in psycho-education groups to the SMART goal and process how one will achieve defined goal.  Therapeutic Goals: -Patients will develop and document one goal related to or their crisis in which brought them into treatment. -Patients will be guided by LCSW using SMART goal setting modality in how to set a measurable, attainable, realistic and time sensitive goal.  -Patients will process barriers in reaching goal. -Patients will process interventions in how to overcome and successful in reaching goal.   Summary of Patient Progress:  Patient Goal: "I want to follow-up with a psychiatrist and therapist". CSW provided support to patient. Patient discussed needing additional support in the community from an outpatient therapist.    Therapeutic Modalities:   Motivational Interviewing Cognitive Behavioral Therapy Crisis Intervention Model SMART goals setting  Marvin Maenza G. Garnette CzechSampson MSW, [facility]WA 10/28/2016 10:46 AM

## 2016-10-28 NOTE — Progress Notes (Signed)
Patient ID: Gerald Ponce, male   DOB: [birth year], 66 y.o.   MRN: [id number] Bright affect, visible, interacting well with peers, attended the Wrap Up group, denied pain, denied SI/SIB/HI, denied AV/H, CBG=190 @ bedtime no coverage required per SSI.

## 2016-10-28 NOTE — Plan of Care (Signed)
Problem: Education: Goal: Ability to describe self-care measures that may prevent or decrease complications (Diabetes Survival Skills Education) will improve Outcome: Progressing Education on meals and  Medication.
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
## 2016-10-28 NOTE — Plan of Care (Signed)
Problem: [facility] Participation in Recreation Therapeutic Interventions Goal: STG-Other Recreation Therapy Goal (Specify) STG: Decision Making - Within 4 treatment sessions, patient will verbalize understanding of the decision making charts in each of 2 treatment sessions to increase healthy decision making skills.  Outcome: Progressing Treatment Session 1; Completed 1 out of 2: At approximately 3:40 pm, LRT met with patient in consultation room. LRT educated and provided patient with charts on decision making. Patient verbalized understanding. LRT encouraged patient to use the charts to help him make better decisions.  Leonette Monarch, LRT/CTRS 03.01.18 4:04 pm  Problem: [facility] Participation in Recreation Therapeutic Interventions Goal: STG-Other Recreation Therapy Goal (Specify) STG: Stress Management - Within 4 treatment sessions, patient will verbalize understanding of the stress management techniques in each of 2 treatment sessions to increase stress management skills.  Outcome: Progressing Treatment Session 1; Completed 1 out of 2: At approximately 3:40 pm, LRT met with patient in consultation room. LRT educated and provided patient with handouts on stress management techniques. Patient verbalized understanding. LRT encouraged patient to read over and practice the stress management techniques.  Leonette Monarch, LRT/CTRS 03.01.18 4:05 pm

## 2016-10-28 NOTE — Progress Notes (Signed)
Recreation Therapy Notes  Date: 03.01.18 Time: 1:00 pm Location: Craft Room  Group Topic: Leisure Education  Goal Area(s) Addresses:  Patient will identify activities for each letter of the alphabet. Patient will verbalize ability to integrate positive leisure into life post d/c. Patient will verbalize ability to use leisure as a Associate Professorcoping skill.  Behavioral Response: Attentive, Interactive  Intervention: Leisure Alphabet.  Activity: Patients were given a leisure alphabet worksheet and were instructed to write healthy leisure activities for each letter of the alphabet.   Education: LRT educated patients on what they need to participate in leisure.  Education Outcome: Acknowledges education/In group clarification offered  Clinical Observations/Feedback: Patient wrote healthy leisure activities. Patient contributed to group discussion by stating some of his healthy leisure activities, and what makes leisure an important coping skill.  Jacquelynn CreeGreene,Gerald Ponce, LRT/CTRS 10/28/2016 2:05 PM

## 2016-10-29 LAB — GLUCOSE, CAPILLARY
Glucose-Capillary: 158 mg/dL — ABNORMAL HIGH (ref 65–99)
Glucose-Capillary: 171 mg/dL — ABNORMAL HIGH (ref 65–99)

## 2016-10-29 MED ORDER — INSULIN GLARGINE 100 UNIT/ML ~~LOC~~ SOLN: 10.0000 [IU] | Freq: Every day | SUBCUTANEOUS | 11 refills | Status: AC

## 2016-10-29 MED ORDER — INSULIN ASPART 100 UNIT/ML ~~LOC~~ SOLN: 8.0000 [IU] | Freq: Three times a day (TID) | SUBCUTANEOUS | 11 refills | Status: AC

## 2016-10-29 MED ORDER — ARIPIPRAZOLE 5 MG PO TABS: 5.0000 mg | ORAL_TABLET | Freq: Every day | ORAL | 1 refills | Status: AC

## 2016-10-29 MED ORDER — VENLAFAXINE HCL ER 150 MG PO CP24: 150.0000 mg | ORAL_CAPSULE | Freq: Every day | ORAL | 1 refills | Status: AC

## 2016-10-29 NOTE — [facility] Group Notes (Signed)
[facility] LCSW Group Therapy  10/29/2016 3:42 PM  Type of Therapy:  Group Therapy  Participation Level:  Active  Participation Quality:  Appropriate  Affect:  Appropriate  Cognitive:  Alert  Insight:  Engaged  Engagement in Therapy:  Engaged  Modes of Intervention:  Activity, Discussion, Education, Problem-solving, Dance movement psychotherapisteality Testing and Support  Summary of Progress/Problems: Feelings around Relapse. Group members discussed the meaning of relapse and shared personal stories of relapse, how it affected them and others, and how they perceived themselves during this time. Group members were encouraged to identify triggers, warning signs and coping skills used when facing the possibility of relapse. Social supports were discussed and explored in detail. Patients also discussed facing disappointment and how that can trigger someone to relapse.  Herman Mell G. Garnette CzechSampson MSW, LCSWA 10/29/2016, 3:42 PM

## 2016-10-29 NOTE — Progress Notes (Signed)
Patient denies SI/HI, denies A/V hallucinations. Patient verbalizes understanding of discharge instructions, follow up care and prescriptions. Patient given all belongings from  locker. Patient escorted out by staff, transported by family. 

## 2016-10-29 NOTE — Plan of Care (Signed)
Problem: Activity: Goal: Sleeping patterns will improve Outcome: Progressing Patient slept for Estimated Hours of 7.45; every 15 minutes safety round maintained, no injury or falls during this shift.    

## 2016-10-29 NOTE — [facility] Group Notes (Signed)
[facility] Group Notes:  (Nursing/MHT/Case Management/Adjunct)  Date:  10/29/2016  Time:  3:33 AM  Type of Therapy:  Psychoeducational Skills  Participation Level:  Active  Participation Quality:  Appropriate, Attentive and Sharing  Affect:  Appropriate  Cognitive:  Alert and Appropriate  Insight:  Appropriate and Good  Engagement in Group:  Engaged  Modes of Intervention:  Discussion, Socialization and Support  Summary of Progress/Problems:  Chancy MilroyLaquanda Y Lauryl Seyer 10/29/2016, 3:33 AM

## 2016-10-29 NOTE — Progress Notes (Signed)
Recreation Therapy Notes  INPATIENT RECREATION TR PLAN  Patient Details Name: Daundre Biel MRN: [id number] DOB: 10-13-1950 Today's Date: 10/29/2016  Rec Therapy Plan Is patient appropriate for Therapeutic Recreation?: Yes Treatment times per week: At least once a week TR Treatment/Interventions: 1:1 session, Group participation (Comment) (Appropriate participation in daily recreational therapy tx)  Discharge Criteria Pt will be discharged from therapy if:: Treatment goals are met, Discharged Treatment plan/goals/alternatives discussed and agreed upon by:: Patient/family  Discharge Summary Short term goals set: See Care Plan Short term goals met: Complete Progress toward goals comments: One-to-one attended Which groups?: Self-esteem, Leisure education One-to-one attended: Stress management, decision making Reason goals not met: N/A Therapeutic equipment acquired: None Reason patient discharged from therapy: Discharge from hospital Pt/family agrees with progress & goals achieved: Yes Date patient discharged from therapy: 10/29/16   Leonette Monarch, LRT/CTRS 10/29/2016, 4:08 PM

## 2016-10-29 NOTE — [facility] Suicide Risk Assessment (Signed)
[facility]   Principal Problem: Episodic mood disorder [facility](HCC) Discharge Diagnoses:  Patient Active Problem List   Diagnosis Date Noted  . HTN (hypertension) [I10] 10/26/2016  . Dyslipidemia [E78.5] 10/26/2016  . GERD (gastroesophageal reflux disease) [K21.9] 10/26/2016  . Hypoglycemia [E16.2] 10/25/2016  . PTSD (post-traumatic stress disorder) [F43.10] 10/25/2016  . Suicide attempt [T14.91XA] 10/25/2016  . Episodic mood disorder (HCC) [F39] 10/25/2016    Total Time spent with patient: 30 minutes  Musculoskeletal: Strength & Muscle Tone: within normal limits Gait & Station: normal Patient leans: N/A  Psychiatric Specialty Exam: Review of Systems  All other systems reviewed and are negative.   Blood pressure 128/71, pulse 83, temperature 98.5 F (36.9 C), temperature source Oral, resp. rate 18, height 5\' 10"  (1.778 m), weight 88.9 kg (196 lb), SpO2 96 %.Body mass index is 28.12 kg/m.  General Appearance: Casual  Eye Contact::  Good  Speech:  Clear and Coherent409  Volume:  Normal  Mood:  Euthymic  Affect:  Appropriate  Thought Process:  Goal Directed and Descriptions of Associations: Intact  Orientation:  Full (Time, Place, and Person)  Thought Content:  WDL  Suicidal Thoughts:  No  Homicidal Thoughts:  No  Memory:  Immediate;   Fair Recent;   Fair Remote;   Fair  Judgement:  Impaired  Insight:  Shallow  Psychomotor Activity:  Normal  Concentration:  Fair  Recall:  [employer]  Fund of Knowledge:Fair  Language: Fair  Akathisia:  No  Handed:  Right  AIMS (if indicated):     Assets:  Communication Skills Desire for Improvement Financial Resources/Insurance Housing Physical Health Resilience Social Support Transportation  Sleep:  Number of Hours: 7.45  Cognition: WNL  ADL's:  Intact   Mental Status Per Nursing Assessment::   On Admission:     Demographic Factors:  Male, Age 66 or older, Divorced or widowed and Caucasian  Loss  Factors: Loss of significant relationship and Legal issues  Historical Factors: Prior suicide attempts, Family history of mental illness or substance abuse and Impulsivity  Risk Reduction Factors:   Sense of responsibility to family, Living with another person, especially a relative, Positive social support and Positive therapeutic relationship  Continued Clinical Symptoms:  Bipolar Disorder:   Depressive phase Depression:   Impulsivity  Cognitive Features That Contribute To Risk:  None    Suicide Risk:  Minimal: No identifiable suicidal ideation.  Patients presenting with no risk factors but with morbid ruminations; may be classified as minimal risk based on the severity of the depressive symptoms  Follow-up Information    [facility].   Specialty:  General Practice Why:  Please attend your appointment with Dr Lamar BenesHertzberg on 11/01/16 at 2pm.  Please bring a copy of your hospital discharge paperwork. Contact information: [address]  [facility]  [phone]           Plan Of Care/Follow-up recommendations:  Activity:  as tolerated. Diet:  low sodium heart healthy ADA diet. Other:  keep follow up appointment.  Kristine LineaJolanta Alissa Pharr, MD 10/29/2016, 9:00 AM

## 2016-10-29 NOTE — Plan of Care (Signed)
Problem: [facility] Participation in Recreation Therapeutic Interventions Goal: STG-Other Recreation Therapy Goal (Specify) STG: Decision Making - Within 4 treatment sessions, patient will verbalize understanding of the decision making charts in each of 2 treatment sessions to increase healthy decision making skills.  Outcome: Completed/Met Date Met: 10/29/16 Treatment Session 2; Completed 2 out of 2: At approximately 8:35 am, LRT met with patient in patient room. Patient verbalized understanding of the decision making charts. LRT encouraged patient to use the charts to help him make better decisions.  Leonette Monarch, LRT/CTRS 03.02.18 4:05 pm  Problem: [facility] Participation in Recreation Therapeutic Interventions Goal: STG-Other Recreation Therapy Goal (Specify) STG: Stress Management - Within 4 treatment sessions, patient will verbalize understanding of the stress management techniques in each of 2 treatment sessions to increase stress management skills.  Outcome: Completed/Met Date Met: 10/29/16 Treatment Session 2; Completed 2 out of 2: At approximately 8:35 am, LRT met with patient in patient room. Patient reported he read over and practiced the stress management techniques. Patient verbalized understanding and reported the techniques were helpful. LRT encouraged patient to continue practicing the stress management techniques.  Leonette Monarch, LRT/CTRS 03.02.18 4:07 pm

## 2016-10-29 NOTE — Progress Notes (Signed)
  [facility] :  Will you be returning to the same living situation after discharge:  Yes,  own home At discharge, do you have transportation home?: Yes,  stepdaughter Do you have the ability to pay for your medications: Yes,  [facility]  Release of information consent forms completed and in the chart;  Patient's signature needed at discharge.  Patient to Follow up at: Follow-up Information    [facility].   Specialty:  General Practice Why:  Please attend your appointment with Dr Lamar BenesHertzberg on 11/01/16 at 2pm.  Please bring a copy of your hospital discharge paperwork. Contact information: [address]  [facility] [place] [phone]           Next level of care provider has [facility] to [facility] Link:no  Safety Planning and Suicide Prevention discussed: Yes,  stepdaughter  Have you used any form of tobacco in the last 30 days? (Cigarettes, Smokeless Tobacco, Cigars, and/or Pipes): No  Has patient been referred to the Quitline?: N/A patient is not a smoker  Patient has been referred for addiction treatment: N/A  Lorri FrederickWierda, Devota Viruet Jon, LCSW 10/29/2016, 2:53 PM

## 2016-10-29 NOTE — Discharge Summary (Signed)
Physician Discharge Summary Note  Patient:  Gerald Ponce is an 66 y.o., male MRN:  [id number] DOB:  [birth year] Patient phone:  [phone] (home)  Patient address:   [address] [place] [address],  Total Time spent with patient: 30 minutes  Date of Admission:  10/26/2016 Date of Discharge: 10/29/2016  Reason for Admission:  Suicide attempt.  Identifying data. Gerald Ponce is a 66 year old male with history of depression and anxiety.  Chief complaint. "I'm just tired."  History of present illness. Information was obtained from the patient and the chart. The patient has a long history of PTSD and some depression. This has been treated at the [facility] with a combination of Effexor and Minipress with much success. For the past 4 months the patient has been under enormous stress. His wife who suffers dementia was removed from the house by Adult Protective Services and placed in dementia unit. The patient is not allowed to visit with her as she gets agitated every time she sees him. There are also legal charges and the patient is to appear in court several times in March. His first court date is on March 1, next on March 7. He does not have a Clinical research associate and has absolutely no idea how things will end up. He feels stressed out and overwhelmed but denies depressive symptoms. On the interview he was very pleasant polite and cooperated and rather tearful when talking about his wife or his troubles. He was admitted to medical floor after intentional overdose on insulin. He denies planning this suicide but as it was happening he kept everything more and more insulin and he didn't care whether he lives or dies. He was at peace with God and ready to accept whatever God's verdict. As he survived, he is now happy to be alive. During his ordeal he developed a strong on with his stepdaughter, decided to tough out legal process, and move to [facility] when everything is over. Per Dr. Toni Amend' notes, who knows him better,  the patient often presents with symptoms suggestive of hypomania. He really denied any current or past symptoms. He only made a comment that some things upset him more than other people. He denies PTSD symptoms as he is treated with Minipress. He denies alcohol or illicit substance use.  Past psychiatric history. He was diagnosed with PTSD at the [place] and is taking Minipress. He served in [place]. He was hospitalized one time at the [place] in December 2017 again in the face of severe stressors. There were no suicide attempts.  Family psychiatric history. Nonreported.  Social history. He now lives at home. His wife was placed on the memory. He has an adopted daughter with whom he is close. His self in the [employer] and has for [place] benefits. He receives [employer] disability. There are serious legal charges pending including spousal abuse and neglect.  Principal Problem: Episodic mood disorder [facility]) Discharge Diagnoses: Patient Active Problem List   Diagnosis Date Noted  . HTN (hypertension) [I10] 10/26/2016  . Dyslipidemia [E78.5] 10/26/2016  . GERD (gastroesophageal reflux disease) [K21.9] 10/26/2016  . Hypoglycemia [E16.2] 10/25/2016  . PTSD (post-traumatic stress disorder) [F43.10] 10/25/2016  . Suicide attempt [T14.91XA] 10/25/2016  . Episodic mood disorder (HCC) [F39] 10/25/2016     Past Medical History:  Past Medical History:  Diagnosis Date  . Diabetes mellitus without complication (HCC)   . Hypertension   . PTSD (post-traumatic stress disorder)     Past Surgical History:  Procedure Laterality Date  . BACK  SURGERY    . HERNIA REPAIR     Family History: History reviewed. No pertinent family history.  Social History:  History  Alcohol Use No     History  Drug Use No    Social History   Social History  . Marital status: Married    Spouse name: N/A  . Number of children: N/A  . Years of education: N/A   Social History Main Topics  . Smoking status: Never Smoker  .  Smokeless tobacco: Never Used  . Alcohol use No  . Drug use: No  . Sexual activity: Not Asked   Other Topics Concern  . None   Social History Narrative  . None    Hospital Course:   Gerald Ponce is a [age] year old male with a history of depression and PTSD transferred from medical floor when he was briefly hospitalized after insulin overdose in the context of severe social stressors.  1. Suicidal ideation. Resolved. The patient is able to contract for safety. He is forward thinking and optimistic about the future.   2. Mood. We increasde Effexor to 150 mg for depression and added Abilify for mood stabilization.   3. PTSD. Minipress was discontinued due to blood pressure drop.   4. Diabetes. He is on metformin, ADA diet, sliding scale insulin and blood glucose monitoring. The patient claims to take insulin glargine and aspart 35/25 unit in am, 20/25 units at bedtime. He was given 10 ubits of insulin glargine at bedtime and 8 units of Novolog before meals.   5. Dyslipidemia. He is on atorvastatin.  6. Hypertension. He is on diltiazem and Lisinopril.  7. GERD. He is on Protonix.   8. Diabetic neuropathy. He is on Neurontin.  9. Vitamin D deficiency. He is on Vit D3 supplementation.  10. Metabolic syndrome monitoring. Lipid panel shows elevated triglicerides, TSH is normal, Hemoglobin A1c 9.    11. EKG. Sinus rhythm. QTc 431.  12. Disposition. He was discharged to home. He will follow up with [facility].  Physical Findings: AIMS:  , ,  ,  ,    CIWA:    COWS:     Musculoskeletal: Strength & Muscle Tone: within normal limits Gait & Station: normal Patient leans: N/A  Psychiatric Specialty Exam: Physical Exam  Nursing note and vitals reviewed. Psychiatric: He has a normal mood and affect. His speech is normal and behavior is normal. Thought content normal. Cognition and memory are normal. He expresses impulsivity.    Review of Systems  All other systems  reviewed and are negative.   Blood pressure 128/71, pulse 83, temperature 98.5 F (36.9 C), temperature source Oral, resp. rate 18, height 5\' 10"  (1.778 m), weight 88.9 kg (196 lb), SpO2 96 %.Body mass index is 28.12 kg/m.  General Appearance: Casual  Eye Contact:  Good  Speech:  Clear and Coherent  Volume:  Normal  Mood:  Euthymic  Affect:  Appropriate  Thought Process:  Goal Directed and Descriptions of Associations: Intact  Orientation:  Full (Time, Place, and Person)  Thought Content:  WDL  Suicidal Thoughts:  No  Homicidal Thoughts:  No  Memory:  Immediate;   Fair Recent;   Fair Remote;   Fair  Judgement:  Impaired  Insight:  Shallow  Psychomotor Activity:  Normal  Concentration:  Concentration: Fair and Attention Span: Fair  Recall:  [employer]  Fund of Knowledge:  Fair  Language:  Fair  Akathisia:  No  Handed:  Right  AIMS (if indicated):  Assets:  Communication Skills Desire for Improvement Financial Resources/Insurance Housing Physical Health Resilience Social Support Transportation  ADL's:  Intact  Cognition:  WNL  Sleep:  Number of Hours: 7.45     Have you used any form of tobacco in the last 30 days? (Cigarettes, Smokeless Tobacco, Cigars, and/or Pipes): No  Has this patient used any form of tobacco in the last 30 days? (Cigarettes, Smokeless Tobacco, Cigars, and/or Pipes) Yes, No  Blood Alcohol level:  Lab Results  Component Value Date   ETH <5 10/25/2016   ETH <5 08/26/2016    Metabolic Disorder Labs:  Lab Results  Component Value Date   HGBA1C 9.0 (H) 10/27/2016   MPG 212 10/27/2016   No results found for: PROLACTIN Lab Results  Component Value Date   CHOL 146 10/27/2016   TRIG 252 (H) 10/27/2016   HDL 49 10/27/2016   CHOLHDL 3.0 10/27/2016   VLDL 50 (H) 10/27/2016   LDLCALC 47 10/27/2016    See Psychiatric Specialty Exam and Suicide Risk Assessment completed by Attending Physician prior to discharge.  Discharge destination:   Home  Is patient on multiple antipsychotic therapies at discharge:  No   Has Patient had three or more failed trials of antipsychotic monotherapy by history:  No  Recommended Plan for Multiple Antipsychotic Therapies: NA  Discharge Instructions    Diet - low sodium heart healthy    Complete by:  As directed    Increase activity slowly    Complete by:  As directed      Allergies as of 10/29/2016      Reactions   Fenofibrate Other (See Comments)   "kidney shut down"      Medication List    TAKE these medications     Indication  ARIPiprazole 5 MG tablet Commonly known as:  ABILIFY Take 1 tablet (5 mg total) by mouth daily.  Indication:  Major Depressive Disorder   aspirin 81 MG EC tablet Take 1 tablet (81 mg total) by mouth daily.  Indication:  Inflammation   atorvastatin 80 MG tablet Commonly known as:  LIPITOR Take 1 tablet (80 mg total) by mouth daily at 6 PM.  Indication:  High Amount of Fats in the Blood   Cholecalciferol 1000 units tablet Take 1 tablet (1,000 Units total) by mouth daily.  Indication:  Vit D defficiency   diltiazem 120 MG 24 hr capsule Commonly known as:  CARDIZEM CD Take 1 capsule (120 mg total) by mouth daily.  Indication:  High Blood Pressure Disorder   gabapentin 300 MG capsule Commonly known as:  NEURONTIN Take 1 capsule (300 mg total) by mouth 2 (two) times daily.  Indication:  Neuropathic Pain   insulin aspart 100 UNIT/ML injection Commonly known as:  novoLOG Inject 8 Units into the skin 3 (three) times daily with meals.  Indication:  Type 2 Diabetes   insulin glargine 100 UNIT/ML injection Commonly known as:  LANTUS Inject 0.1 mLs (10 Units total) into the skin at bedtime.  Indication:  Type 2 Diabetes   lisinopril 20 MG tablet Commonly known as:  PRINIVIL,ZESTRIL Take 1 tablet (20 mg total) by mouth daily.  Indication:  High Blood Pressure Disorder   metFORMIN 1000 MG tablet Commonly known as:  GLUCOPHAGE Take 0.5 tablets  (500 mg total) by mouth 2 (two) times daily with a meal.  Indication:  Type 2 Diabetes   omeprazole 20 MG capsule Commonly known as:  PRILOSEC Take 1 capsule (20 mg total) by mouth daily.  Indication:  Gastroesophageal Reflux Disease   venlafaxine XR 150 MG 24 hr capsule Commonly known as:  EFFEXOR-XR Take 1 capsule (150 mg total) by mouth daily with breakfast.  Indication:  Major Depressive Disorder      Follow-up Information    [facility]. Go on 11/01/2016.   Specialty:  General Practice Why:  Please attend your appointment with Dr Lamar Benes on 11/01/16 at 2pm.  Please bring a copy of your hospital discharge paperwork. Contact information: [address]  [facility] [address] [phone]           Follow-up recommendations:  Activity:  as tolerated. Diet:  low sodium heart healthy ADA diet. Other:  keep follow up appointments.  Comments:     Signed: Kristine Linea, MD 10/29/2016, 8:50 AM

## 2016-10-29 NOTE — Progress Notes (Signed)
Patient ID: Gerald Ponce, male   DOB: Mar 09, 1951, 66 y.o.   MRN: [id number] A&Ox3, denied pain, mood and affect appropriate, denies SI/HI, denies AV/H; anticipating discharge in the morning.

## 2017-06-14 IMAGING — DX DG CHEST 1V
1 series · 1 of 1 positions shown · non-contrast
Comparison: None.

CLINICAL DATA: Possible overdose this morning.

EXAM:
CHEST 1 VIEW

[chest ap]
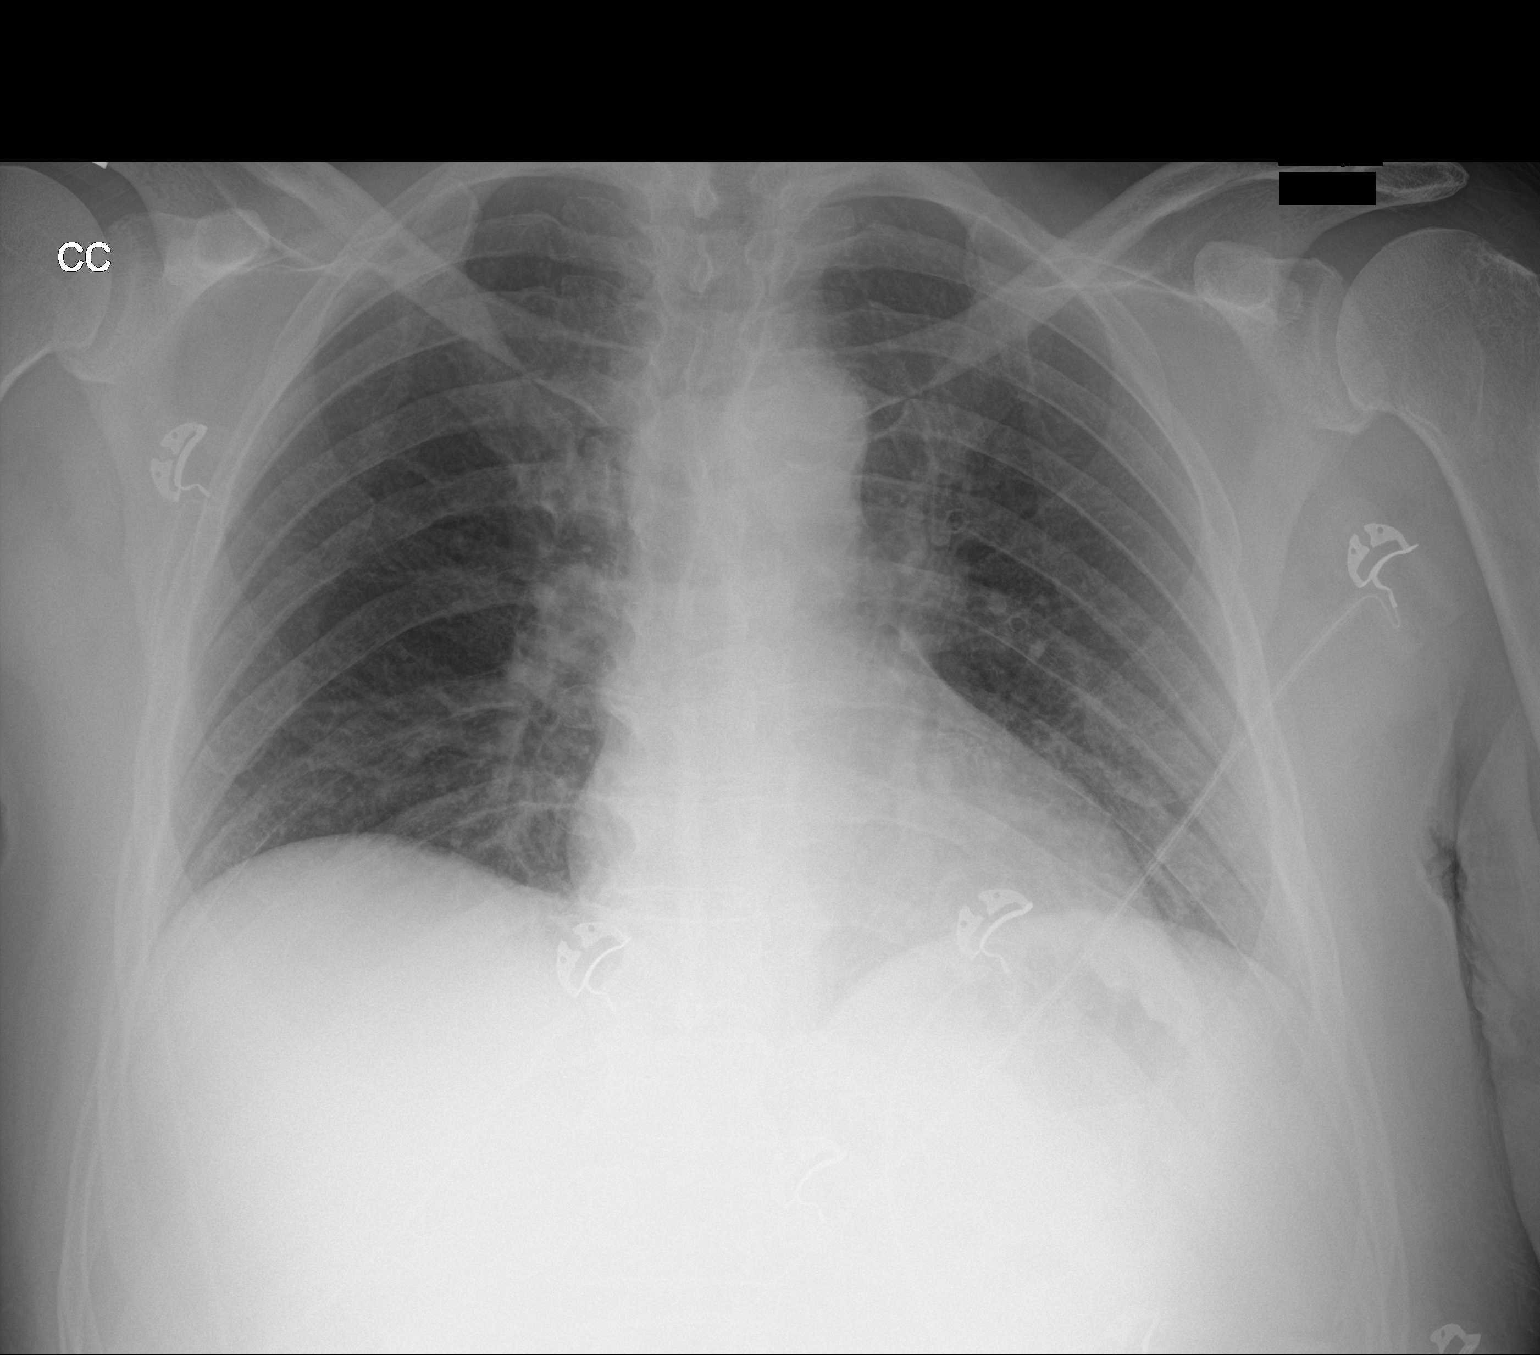

[1 of 1 positions shown; findings below may reference images not displayed]

FINDINGS: Low volume chest with interstitial crowding. There is no edema,
consolidation, effusion, or pneumothorax. Normal heart size and
mediastinal contours. Coronary stent noted.
IMPRESSION: Low volume portable chest.  No acute finding.

## 2019-01-29 DEATH — deceased
